# Patient Record
Sex: Male | Born: 1950 | Race: White | Hispanic: No | Marital: Single | Smoking: Former smoker
Health system: Southern US, Community
[De-identification: ages and names within clinical notes are randomized; demographics above are authoritative.]

## PROBLEM LIST (undated history)

## (undated) DIAGNOSIS — F1910 Other psychoactive substance abuse, uncomplicated: Secondary | ICD-10-CM

## (undated) DIAGNOSIS — B1920 Unspecified viral hepatitis C without hepatic coma: Secondary | ICD-10-CM

## (undated) DIAGNOSIS — F419 Anxiety disorder, unspecified: Secondary | ICD-10-CM

## (undated) DIAGNOSIS — Z5189 Encounter for other specified aftercare: Secondary | ICD-10-CM

## (undated) DIAGNOSIS — J449 Chronic obstructive pulmonary disease, unspecified: Secondary | ICD-10-CM

## (undated) DIAGNOSIS — C801 Malignant (primary) neoplasm, unspecified: Secondary | ICD-10-CM

## (undated) HISTORY — PX: OTHER SURGICAL HISTORY: SHX169

## (undated) HISTORY — DX: Anxiety disorder, unspecified: F41.9

## (undated) HISTORY — DX: Chronic obstructive pulmonary disease, unspecified: J44.9

## (undated) HISTORY — DX: Unspecified viral hepatitis C without hepatic coma: B19.20

## (undated) HISTORY — DX: Other psychoactive substance abuse, uncomplicated: F19.10

## (undated) HISTORY — DX: Encounter for other specified aftercare: Z51.89

---

## 2005-06-09 ENCOUNTER — Emergency Department: Payer: Self-pay | Admitting: Emergency Medicine

## 2005-10-18 ENCOUNTER — Emergency Department (HOSPITAL_COMMUNITY): Admission: EM | Admit: 2005-10-18 | Discharge: 2005-10-18 | Payer: Self-pay | Admitting: Emergency Medicine

## 2006-01-26 ENCOUNTER — Emergency Department: Payer: Self-pay | Admitting: Emergency Medicine

## 2006-01-26 ENCOUNTER — Other Ambulatory Visit: Payer: Self-pay

## 2007-10-19 ENCOUNTER — Ambulatory Visit: Payer: Self-pay | Admitting: Internal Medicine

## 2007-10-19 DIAGNOSIS — M79609 Pain in unspecified limb: Secondary | ICD-10-CM | POA: Insufficient documentation

## 2008-09-18 ENCOUNTER — Emergency Department (HOSPITAL_COMMUNITY): Admission: EM | Admit: 2008-09-18 | Discharge: 2008-09-18 | Payer: Self-pay | Admitting: Emergency Medicine
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
## 2010-10-24 ENCOUNTER — Emergency Department (HOSPITAL_COMMUNITY): Payer: Worker's Compensation

## 2010-10-24 ENCOUNTER — Emergency Department (HOSPITAL_COMMUNITY): Payer: Self-pay

## 2010-10-24 ENCOUNTER — Emergency Department (HOSPITAL_COMMUNITY)
Admission: EM | Admit: 2010-10-24 | Discharge: 2010-10-24 | Disposition: A | Discharge status: Home / Self Care | Payer: Worker's Compensation | Attending: Emergency Medicine | Admitting: Emergency Medicine

## 2010-10-24 DIAGNOSIS — IMO0002 Reserved for concepts with insufficient information to code with codable children: Secondary | ICD-10-CM | POA: Insufficient documentation

## 2010-10-24 DIAGNOSIS — Y990 Civilian activity done for income or pay: Secondary | ICD-10-CM | POA: Insufficient documentation

## 2010-10-24 DIAGNOSIS — W19XXXA Unspecified fall, initial encounter: Secondary | ICD-10-CM | POA: Insufficient documentation

## 2010-10-24 DIAGNOSIS — S20219A Contusion of unspecified front wall of thorax, initial encounter: Secondary | ICD-10-CM | POA: Insufficient documentation

## 2010-10-24 DIAGNOSIS — S88119A Complete traumatic amputation at level between knee and ankle, unspecified lower leg, initial encounter: Secondary | ICD-10-CM | POA: Insufficient documentation

## 2011-01-14 ENCOUNTER — Other Ambulatory Visit (HOSPITAL_COMMUNITY): Payer: Self-pay | Admitting: Family Medicine

## 2011-01-14 ENCOUNTER — Ambulatory Visit (HOSPITAL_COMMUNITY)
Admission: RE | Admit: 2011-01-14 | Discharge: 2011-01-14 | Disposition: A | Discharge status: Home / Self Care | Payer: Medicare Other | Source: Ambulatory Visit | Attending: Family Medicine | Admitting: Family Medicine

## 2011-01-14 DIAGNOSIS — M25559 Pain in unspecified hip: Secondary | ICD-10-CM | POA: Insufficient documentation

## 2011-01-14 DIAGNOSIS — M51379 Other intervertebral disc degeneration, lumbosacral region without mention of lumbar back pain or lower extremity pain: Secondary | ICD-10-CM | POA: Insufficient documentation

## 2011-01-14 DIAGNOSIS — M1990 Unspecified osteoarthritis, unspecified site: Secondary | ICD-10-CM

## 2011-01-14 DIAGNOSIS — M545 Low back pain, unspecified: Secondary | ICD-10-CM | POA: Insufficient documentation

## 2011-01-14 DIAGNOSIS — M5137 Other intervertebral disc degeneration, lumbosacral region: Secondary | ICD-10-CM | POA: Insufficient documentation

## 2011-01-27 ENCOUNTER — Telehealth (INDEPENDENT_AMBULATORY_CARE_PROVIDER_SITE_OTHER): Payer: Self-pay | Admitting: *Deleted

## 2011-01-27 NOTE — Telephone Encounter (Signed)
Recvd referral on 01/15/2011 for hep c,called patient to advise of Sept appt with NUR he stated that he could not wait and wanted to go to [facility].so i shredded referral and patient hung up.On July the 23rd recvd second referral and called patient ,advised him appt will still be in September ,again the man cursed me and stated he has read everything it was to know about hep c from the Internet.i advised patient this was a very complex office visit with testing and teaching ,he then said he knew more than Dr Laural Golden and our office.at this point a appt will not be made for this gentleman due to his rudeness and cursing wanted to type  documentation can be made.

## 2011-01-28 NOTE — Telephone Encounter (Signed)
I agree with not making an appointment for this patient please let the primary care physician know thank you

## 2011-04-03 ENCOUNTER — Other Ambulatory Visit (HOSPITAL_COMMUNITY): Payer: Self-pay | Admitting: Family Medicine

## 2011-04-03 DIAGNOSIS — E0590 Thyrotoxicosis, unspecified without thyrotoxic crisis or storm: Secondary | ICD-10-CM

## 2011-04-08 ENCOUNTER — Encounter (HOSPITAL_COMMUNITY): Payer: Medicare Other

## 2011-04-09 ENCOUNTER — Encounter (HOSPITAL_COMMUNITY): Payer: Medicare Other

## 2011-05-01 ENCOUNTER — Ambulatory Visit (INDEPENDENT_AMBULATORY_CARE_PROVIDER_SITE_OTHER): Payer: Medicare Other | Admitting: Gastroenterology

## 2011-05-01 DIAGNOSIS — B182 Chronic viral hepatitis C: Secondary | ICD-10-CM

## 2011-05-01 DIAGNOSIS — D649 Anemia, unspecified: Secondary | ICD-10-CM

## 2011-05-01 LAB — CBC WITH DIFFERENTIAL/PLATELET
Basophils Absolute: 0 10*3/uL (ref 0.0–0.1)
Basophils Relative: 1 % (ref 0–1)
Eosinophils Absolute: 0.2 10*3/uL (ref 0.0–0.7)
Eosinophils Relative: 4 % (ref 0–5)
HCT: 49.4 % (ref 39.0–52.0)
Hemoglobin: 16.7 g/dL (ref 13.0–17.0)
Lymphocytes Relative: 37 % (ref 12–46)
Lymphs Abs: 2.1 10*3/uL (ref 0.7–4.0)
MCH: 31.6 pg (ref 26.0–34.0)
MCHC: 33.8 g/dL (ref 30.0–36.0)
MCV: 93.4 fL (ref 78.0–100.0)
Monocytes Absolute: 0.5 10*3/uL (ref 0.1–1.0)
Monocytes Relative: 8 % (ref 3–12)
Neutro Abs: 2.9 10*3/uL (ref 1.7–7.7)
Neutrophils Relative %: 50 % (ref 43–77)
Platelets: 224 10*3/uL (ref 150–400)
RBC: 5.29 MIL/uL (ref 4.22–5.81)
RDW: 13.4 % (ref 11.5–15.5)
WBC: 5.7 10*3/uL (ref 4.0–10.5)

## 2011-05-01 LAB — TSH: TSH: 1.264 u[IU]/mL (ref 0.350–4.500)

## 2011-05-02 LAB — COMPLETE METABOLIC PANEL WITH GFR
ALT: 140 U/L — ABNORMAL HIGH (ref 0–53)
AST: 134 U/L — ABNORMAL HIGH (ref 0–37)
Albumin: 4.5 g/dL (ref 3.5–5.2)
Alkaline Phosphatase: 98 U/L (ref 39–117)
BUN: 12 mg/dL (ref 6–23)
CO2: 19 mEq/L (ref 19–32)
Calcium: 9 mg/dL (ref 8.4–10.5)
Chloride: 102 mEq/L (ref 96–112)
Creat: 0.74 mg/dL (ref 0.50–1.35)
GFR, Est African American: >90 mL/min (ref >90)
GFR, Est Non African American: >90 mL/min (ref >90)
Glucose, Bld: 88 mg/dL (ref 70–99)
Potassium: 4.1 mEq/L (ref 3.5–5.3)
Sodium: 139 mEq/L (ref 135–145)
Total Bilirubin: 1 mg/dL (ref 0.3–1.2)
Total Protein: 7.3 g/dL (ref 6.0–8.3)

## 2011-05-02 LAB — AFP TUMOR MARKER: AFP-Tumor Marker: 18.4 ng/mL — ABNORMAL HIGH (ref 0.0–8.0)

## 2011-05-02 LAB — HEPATITIS B CORE ANTIBODY, TOTAL: Hep B Core Total Ab: POSITIVE — AB

## 2011-05-02 LAB — PROTIME-INR
INR: 0.98 (ref <1.50)
Prothrombin Time: 13.4 seconds (ref 11.6–15.2)

## 2011-05-02 LAB — ANA: Anti Nuclear Antibody(ANA): NEGATIVE

## 2011-05-02 LAB — HEPATITIS A ANTIBODY, TOTAL: Hep A Total Ab: NEGATIVE

## 2011-05-02 LAB — HEPATITIS B SURFACE ANTIBODY,QUALITATIVE: Hep B S Ab: POSITIVE — AB

## 2011-05-08 NOTE — Progress Notes (Addendum)
NAME:  Jermaine Gonzalez, Jermaine Gonzalez  MR#:  [id number]      DATE:  05/01/2011  DOB:  1951-06-19    cc: Consulting Physician:  None. Primary Care Physician:  Same. Referring Physician:  Pearson Grippe, MD, [facility], [address], [place], [place] [address], Fax [phone]    Reason for visit:  Positive qualitative hepatitis C RNA.    History:  The patient is a 60 year old gentleman who I have been asked to see in consultation by Dr. Lorriane Shire regarding a positive qualitative HCV RNA.   According to the patient, he was first diagnosed with hepatitis C approximately 6-7 years ago, but was not assessed for treatment until now. He is unaware of his genotype. He has not been biopsied. He  currently has no symptoms referable to his history of hepatitis C nor are there symptoms to suggest cryoglobulin mediated or decompensated liver disease.  With respect to risk factors for liver disease, he drank alcohol on a daily basis until a year ago when he stopped and started to attend AA. He now attends AA meetings twice weekly. There is no history of DWIs.  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 He has tattoos but no unsterile body piercing or blood transfusion prior to 1992. There is no family  history of liver disease. 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   PAST MEDICAL HISTORY:  Denies diabetes, dyslipidemia, coronary artery disease, hypertension, or dysthyroidism.    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 Surgery to left knee cap. 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    Past psychiatric history:  Denies.   CURRENT MEDICATIONS:  Xanax point p.r.n., ibuprofen p.r.n.   ALLERGIES:  Denies.     Habits:  Smoking, less than half pack of cigarettes per day.   FAMILY HISTORY:  As above.    Social history:  As above.   REVIEW OF SYSTEMS:  All 10 systems reviewed today with the patient and the review was signed and placed in the chart. His CES-D was 20.   PHYSICAL EXAMINATION:    Constitutional:  Appeared stated age without significant peripheral wasting. Vital signs: Height 69.5 inches, weight 236 pounds, blood pressure 155/100, pulse of 75, temperature 97.8 Fahrenheit. Ears, nose, mouth and  throat:  Unremarkable oropharynx.  No thyromegaly or neck masses.  Chest:  Resonant to percussion.  Clear to auscultation.  Cardiovascular:  Heart sounds normal S1, S2 without murmurs or rubs.   There is no peripheral edema.  Abdominal:  Normal bowel sounds.  No masses or tenderness.  I could not appreciate a liver edge or spleen tip.  I could not appreciate any hernias.  Lymphatics:  No cervical or  inguinal lymphadenopathy.  Central Nervous System:  No asterixis or focal neurologic findings.  Dermatologic:  Anicteric without palmar  erythema or spider angiomata.  Eyes:  Anicteric sclerae.  Pupils are equal and reactive to light.   laboratories:  On 12/16/2010, hepatitis B surface antigen was negative. His hepatitis C antibody was positive.  On 12/17/2010, his qualitative HCV RNA was positive. This is the only lab results I received.   Assessment:  The patient is a 60 year old man with history of genotype unknown hepatitis C,  naive to treatment. He appears to be clinically well compensated. I do not have any lab work to determine how biochemically  compensated he is. Though he has an alcohol history in the past, to his credit, he stopped drinking over a year ago and is attending counseling. I do not see any contraindication to treating him should  he be willing.   In my discussion today with the patient, we discussed the nature and natural history of hepatitis C. We discussed the  significance of genotyping him. We discussed the role of genotyping to determine the  need for biopsy. I explained how a biopsy is done and its utility for genotype 1. We then discussed treatment with pegylated interferon and ribavirin for all genotypes and the addition of telaprevir or  boceprevir if genotype 3. I reviewed the specific systems, constitutional, psychiatric side effects of therapy. We discussed response rates. I discussed treatment protocol for clinic. Having said  this the patient was interested in participating in treatment should he be eligible.  I also discussed the risk of contagion of hepatitis C.  We also discussed the possibility of participating in clinical trials. I have explained that we were not obliged to offer him trials as these are not standard of care. We discussed the fact the side effects and  efficacy are not completely known. However, the patient is willing to consider trial should he be eligible.   plan:  1. Standard labs. 2. Check genotype. 3. Test for hepatitis A and B immunity. 4. If genotype 1, proceed to liver biopsy and follow up thereafter. Genotype 2 or 3, proceed directly to follow up thereafter. 5. I will submit his records to [facility] to review for research purposes. 6. I have given him literature to read on hepatitis C.            Marty Heck, MD   ADDENDUM Hep A nave.  Hep B immune.  Genotype pending.    ADDENDUM  05/15/11  Genotype 1a - will order biopsy.  [place]  D:  Thu Oct 25 16:03:03 2012 ; T:  Thu Oct 25 17:40:17 2012  Job #:  [id number]

## 2011-05-12 LAB — HEPATITIS C GENOTYPE

## 2011-05-15 NOTE — Progress Notes (Signed)
Addended by: Marty Heck on: 05/15/2011 09:32 PM   Modules accepted: Orders

## 2011-08-28 ENCOUNTER — Encounter (HOSPITAL_COMMUNITY): Payer: Self-pay

## 2011-08-28 ENCOUNTER — Encounter (HOSPITAL_COMMUNITY)
Admission: RE | Admit: 2011-08-28 | Discharge: 2011-08-28 | Disposition: A | Discharge status: Home / Self Care | Payer: Medicare Other | Source: Ambulatory Visit | Attending: Family Medicine | Admitting: Family Medicine

## 2011-08-28 DIAGNOSIS — E0590 Thyrotoxicosis, unspecified without thyrotoxic crisis or storm: Secondary | ICD-10-CM

## 2011-08-28 HISTORY — DX: Malignant (primary) neoplasm, unspecified: C80.1

## 2011-08-28 MED ORDER — SODIUM IODIDE I 131 CAPSULE
10.0000 | Freq: Once | INTRAVENOUS | Status: AC | PRN
  Administered 2011-08-28: 13 via ORAL

## 2011-08-29 ENCOUNTER — Encounter (HOSPITAL_COMMUNITY)
Admission: RE | Admit: 2011-08-29 | Discharge: 2011-08-29 | Disposition: A | Discharge status: Home / Self Care | Payer: Medicare Other | Source: Ambulatory Visit | Attending: Family Medicine | Admitting: Family Medicine

## 2011-08-29 DIAGNOSIS — E0590 Thyrotoxicosis, unspecified without thyrotoxic crisis or storm: Secondary | ICD-10-CM | POA: Insufficient documentation

## 2011-08-29 MED ORDER — SODIUM PERTECHNETATE TC 99M INJECTION
10.0000 | Freq: Once | INTRAVENOUS | Status: AC | PRN
  Administered 2011-08-29: 10 via INTRAVENOUS

## 2012-11-04 ENCOUNTER — Ambulatory Visit (HOSPITAL_COMMUNITY)
Admission: RE | Admit: 2012-11-04 | Discharge: 2012-11-04 | Disposition: A | Discharge status: Home / Self Care | Payer: Medicare Other | Source: Ambulatory Visit | Attending: Family Medicine | Admitting: Family Medicine

## 2012-11-04 ENCOUNTER — Other Ambulatory Visit (HOSPITAL_COMMUNITY): Payer: Self-pay | Admitting: Family Medicine

## 2012-11-04 DIAGNOSIS — M1990 Unspecified osteoarthritis, unspecified site: Secondary | ICD-10-CM

## 2012-11-04 DIAGNOSIS — M5136 Other intervertebral disc degeneration, lumbar region: Secondary | ICD-10-CM

## 2012-11-04 DIAGNOSIS — M5134 Other intervertebral disc degeneration, thoracic region: Secondary | ICD-10-CM

## 2012-11-04 DIAGNOSIS — M545 Low back pain, unspecified: Secondary | ICD-10-CM | POA: Insufficient documentation

## 2012-11-04 DIAGNOSIS — IMO0002 Reserved for concepts with insufficient information to code with codable children: Secondary | ICD-10-CM | POA: Insufficient documentation

## 2012-11-04 DIAGNOSIS — M546 Pain in thoracic spine: Secondary | ICD-10-CM | POA: Insufficient documentation

## 2014-03-01 ENCOUNTER — Encounter (INDEPENDENT_AMBULATORY_CARE_PROVIDER_SITE_OTHER): Payer: Self-pay | Admitting: *Deleted

## 2014-04-04 ENCOUNTER — Encounter (INDEPENDENT_AMBULATORY_CARE_PROVIDER_SITE_OTHER): Payer: Self-pay | Admitting: Internal Medicine

## 2014-04-04 ENCOUNTER — Ambulatory Visit (INDEPENDENT_AMBULATORY_CARE_PROVIDER_SITE_OTHER): Payer: Medicare HMO | Admitting: Internal Medicine

## 2014-04-04 ENCOUNTER — Encounter (INDEPENDENT_AMBULATORY_CARE_PROVIDER_SITE_OTHER): Payer: Self-pay | Admitting: *Deleted

## 2014-04-04 VITALS — BP 140/62 | HR 76 | Temp 97.7°F | Ht 69.5 in | Wt 141.6 lb

## 2014-04-04 DIAGNOSIS — B182 Chronic viral hepatitis C: Secondary | ICD-10-CM

## 2014-04-04 DIAGNOSIS — Z8619 Personal history of other infectious and parasitic diseases: Secondary | ICD-10-CM | POA: Insufficient documentation
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
## 2014-04-04 NOTE — Patient Instructions (Signed)
Labs and elastrography.

## 2014-04-06 ENCOUNTER — Ambulatory Visit (HOSPITAL_COMMUNITY)
Admission: RE | Admit: 2014-04-06 | Discharge: 2014-04-06 | Disposition: A | Discharge status: Home / Self Care | Payer: Medicare HMO | Source: Ambulatory Visit | Attending: Internal Medicine | Admitting: Internal Medicine

## 2014-04-06 DIAGNOSIS — B182 Chronic viral hepatitis C: Secondary | ICD-10-CM | POA: Diagnosis present

## 2014-04-06 DIAGNOSIS — R7989 Other specified abnormal findings of blood chemistry: Secondary | ICD-10-CM | POA: Insufficient documentation

## 2014-04-06 LAB — CBC WITH DIFFERENTIAL/PLATELET
Basophils Absolute: 0.1 10*3/uL (ref 0.0–0.1)
Basophils Relative: 1 % (ref 0–1)
Eosinophils Absolute: 0.4 10*3/uL (ref 0.0–0.7)
Eosinophils Relative: 7 % — ABNORMAL HIGH (ref 0–5)
HCT: 48.6 % (ref 39.0–52.0)
Hemoglobin: 17 g/dL (ref 13.0–17.0)
Lymphocytes Relative: 40 % (ref 12–46)
Lymphs Abs: 2.3 10*3/uL (ref 0.7–4.0)
MCH: 31.3 pg (ref 26.0–34.0)
MCHC: 35 g/dL (ref 30.0–36.0)
MCV: 89.5 fL (ref 78.0–100.0)
Monocytes Absolute: 0.5 10*3/uL (ref 0.1–1.0)
Monocytes Relative: 8 % (ref 3–12)
Neutro Abs: 2.5 10*3/uL (ref 1.7–7.7)
Neutrophils Relative %: 44 % (ref 43–77)
Platelets: 190 10*3/uL (ref 150–400)
RBC: 5.43 MIL/uL (ref 4.22–5.81)
RDW: 14.1 % (ref 11.5–15.5)
WBC: 5.7 10*3/uL (ref 4.0–10.5)

## 2014-04-06 LAB — HEPATIC FUNCTION PANEL
ALT: 108 U/L — ABNORMAL HIGH (ref 0–53)
AST: 80 U/L — ABNORMAL HIGH (ref 0–37)
Albumin: 4.1 g/dL (ref 3.5–5.2)
Alkaline Phosphatase: 124 U/L — ABNORMAL HIGH (ref 39–117)
Bilirubin, Direct: 0.1 mg/dL (ref 0.0–0.3)
Indirect Bilirubin: 0.3 mg/dL (ref 0.2–1.2)
Total Bilirubin: 0.4 mg/dL (ref 0.2–1.2)
Total Protein: 6.7 g/dL (ref 6.0–8.3)

## 2014-04-06 LAB — PROTIME-INR
INR: 1 (ref <1.50)
Prothrombin Time: 13.2 seconds (ref 11.6–15.2)

## 2014-04-07 LAB — HEPATITIS C RNA QUANTITATIVE
HCV Quantitative Log: 6.35 {Log} — ABNORMAL HIGH (ref <1.18)
HCV Quantitative: 2224382 IU/mL — ABNORMAL HIGH (ref <15)

## 2014-04-07 LAB — AFP TUMOR MARKER: AFP-Tumor Marker: 19.1 ng/mL — ABNORMAL HIGH (ref <6.1)

## 2014-04-09 IMAGING — CR DG LUMBAR SPINE COMPLETE 4+V
5 series · 5 of 5 positions shown · non-contrast
Comparison: 01/14/2011

CLINICAL DATA: Evaluate for degenerative disc disease.  Lower back
pain with no recent injury.  Prosthetic leg

LUMBAR SPINE - COMPLETE 4+ VIEW

[view not recorded (1 of 5)]
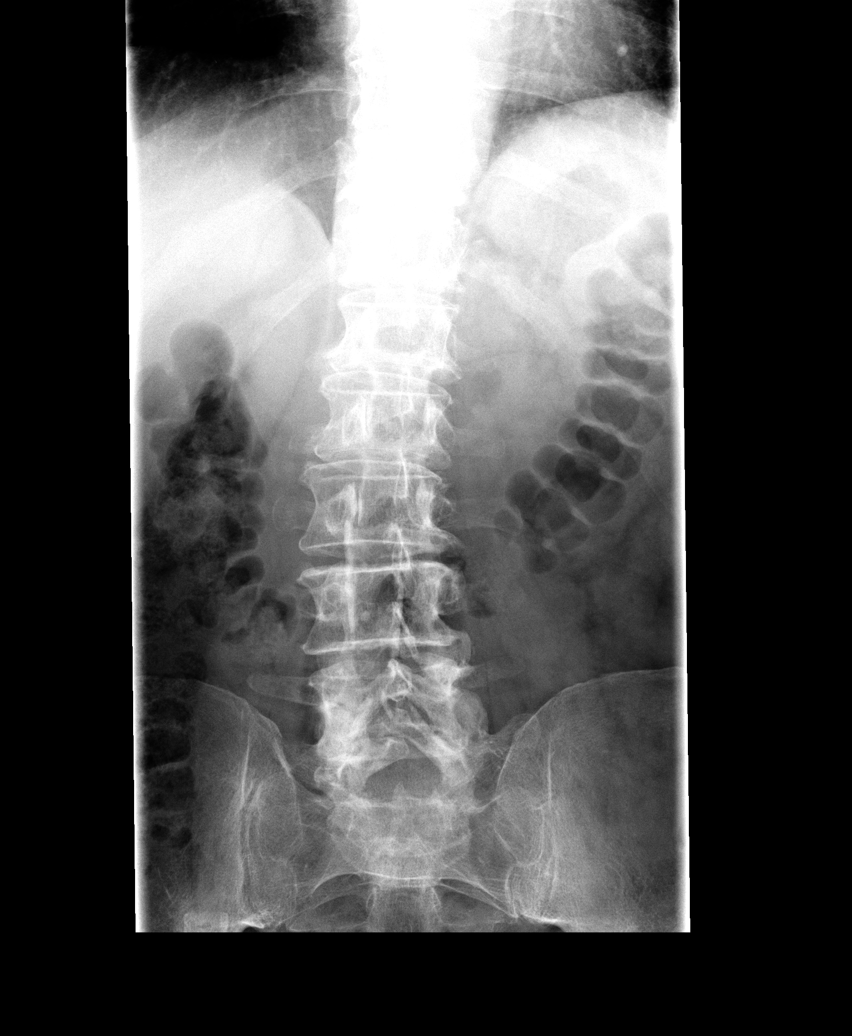

[view not recorded (2 of 5)]
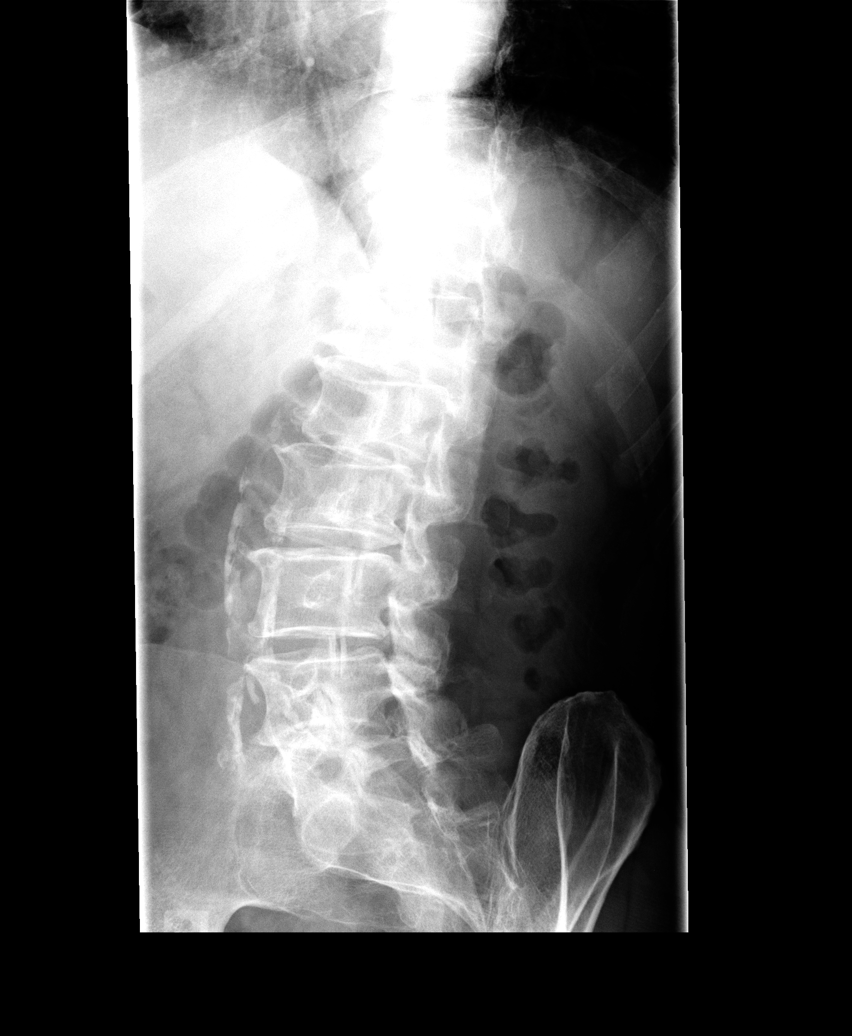

[view not recorded (3 of 5)]
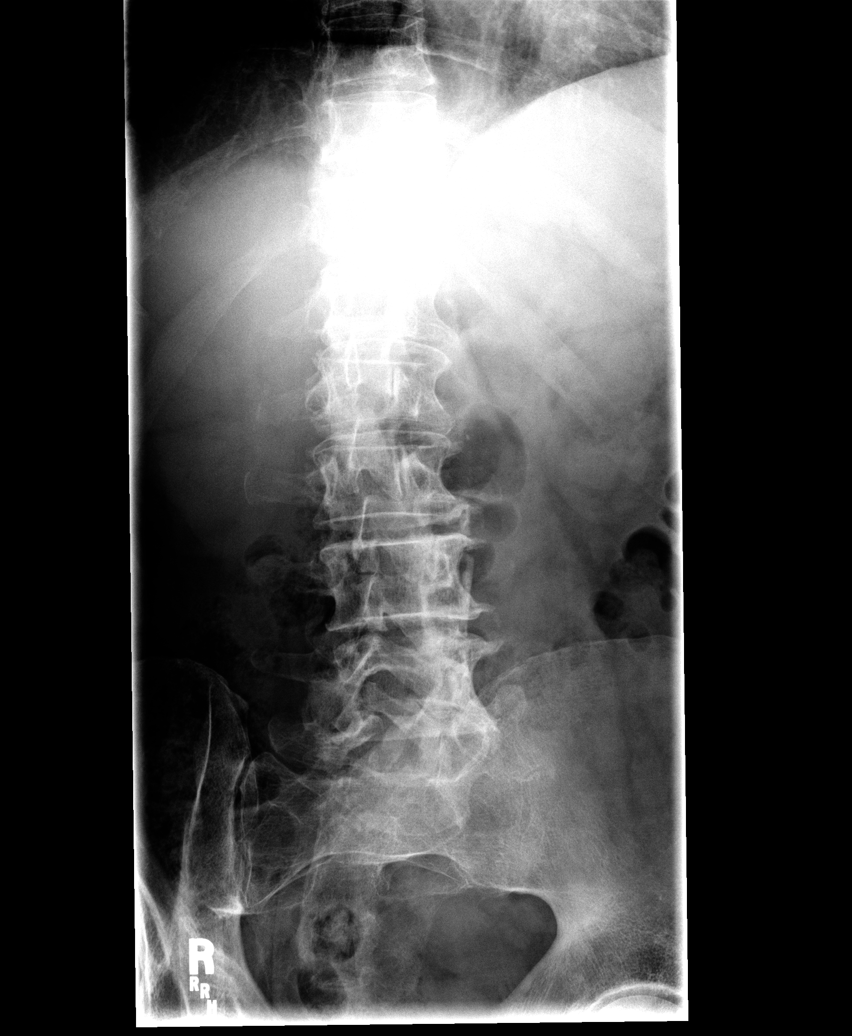

[view not recorded (4 of 5)]
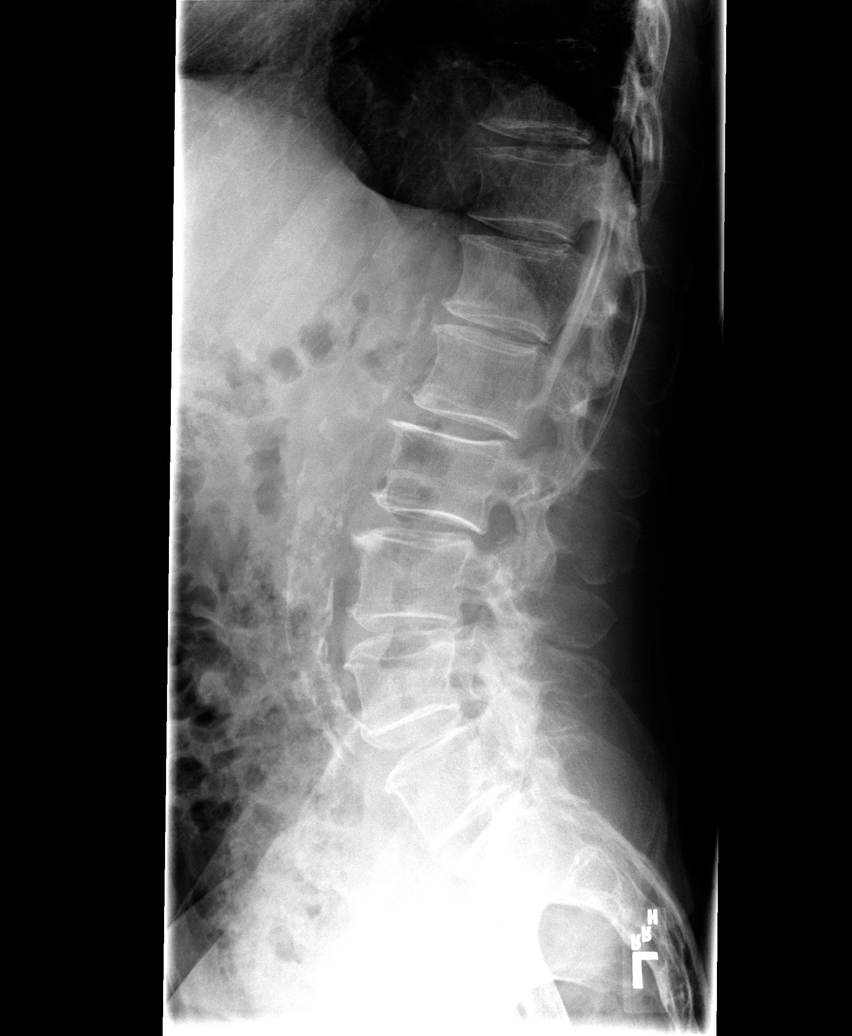

[view not recorded (5 of 5)]
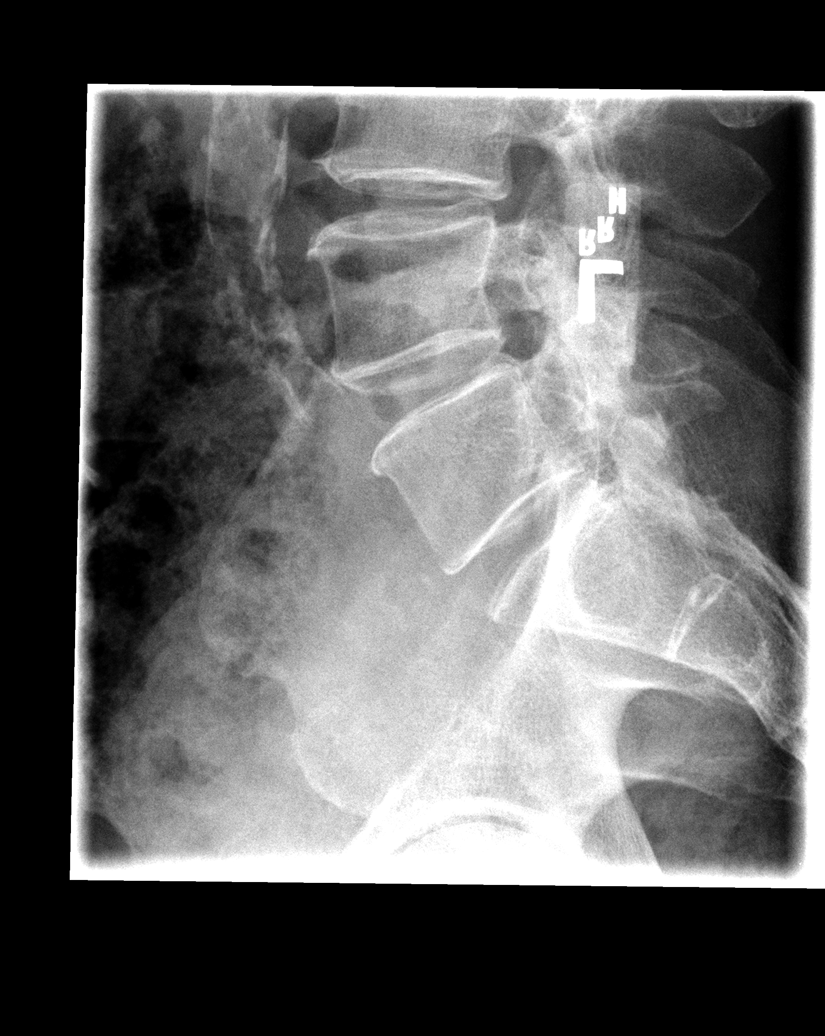

[5 of 5 positions shown; findings below may reference images not displayed]

FINDINGS: There are six lumbar type vertebral bodies with an L6
transitional vertebral body with spina bifida occulta and left
sided assimilation joint. Mild curvature of the lumbar spine,
convex right appears stable. Bony alignment otherwise appears
intact. Vertebral body heights appear maintained.

There is mild disc space narrowing identified throughout the lumbar
spine and the degree of narrowing at the individual disc spaces
appears unchanged in comparison with prior exam.  Mild
osteophytosis at multiple levels also appears stable.  Oblique
views demonstrate mild facet sclerosis at multiple levels without
associated pars defects.  No significant advanced in the degree of
degenerative change is apparent since 7827.

The sacroiliac joints appear maintained and the sacral white lines
appear intact.

No acute bony abnormality is identified.
IMPRESSION: Unchanged degenerative change since 7827 as noted above.  No acute
findings.

## 2014-04-14 ENCOUNTER — Telehealth (INDEPENDENT_AMBULATORY_CARE_PROVIDER_SITE_OTHER): Payer: Self-pay | Admitting: Internal Medicine

## 2014-04-14 NOTE — Telephone Encounter (Signed)
Have received approval for Harvoni x 8 weeks. I have given Jermaine Gonzalez the Rx. When he receives the medication, he will call our office and let us know his start date. He knows he will need an OV every 4 week and he says he will be compliant.

## 2014-04-18 ENCOUNTER — Telehealth (INDEPENDENT_AMBULATORY_CARE_PROVIDER_SITE_OTHER): Payer: Self-pay | Admitting: Internal Medicine

## 2014-04-18 NOTE — Telephone Encounter (Signed)
He will start 04/19/2014. He has committed to OV every 4 weeks with labs.

## 2014-04-18 NOTE — Telephone Encounter (Signed)
Start date 04/19/2014   Jermaine Gonzalez in 4 weeks.    Tammy, Hep C quaint, Hepatic function and CBC with diff in 4 weeks.

## 2014-04-19 ENCOUNTER — Telehealth (INDEPENDENT_AMBULATORY_CARE_PROVIDER_SITE_OTHER): Payer: Self-pay | Admitting: *Deleted

## 2014-04-19 DIAGNOSIS — B182 Chronic viral hepatitis C: Secondary | ICD-10-CM

## 2014-04-19 NOTE — Telephone Encounter (Signed)
.  Per Lelon Perla patient will have labs drawn in 4 weeks. Patient started his Hepatitis C medication on 04/19/14.

## 2014-04-19 NOTE — Telephone Encounter (Signed)
Patient's labs are noted for 05/17/14.

## 2014-04-20 NOTE — Telephone Encounter (Signed)
Apt has been scheduled for 05/18/14 at 2:00 pm with Deberah Castle, NP.

## 2014-04-20 NOTE — Telephone Encounter (Signed)
Patient advised of lab and apt date. Voices understood.

## 2014-04-26 ENCOUNTER — Other Ambulatory Visit (INDEPENDENT_AMBULATORY_CARE_PROVIDER_SITE_OTHER): Payer: Self-pay | Admitting: *Deleted

## 2014-04-26 ENCOUNTER — Encounter (INDEPENDENT_AMBULATORY_CARE_PROVIDER_SITE_OTHER): Payer: Self-pay | Admitting: *Deleted

## 2014-04-26 DIAGNOSIS — B182 Chronic viral hepatitis C: Secondary | ICD-10-CM

## 2014-05-16 LAB — CBC WITH DIFFERENTIAL/PLATELET
Basophils Absolute: 0.1 10*3/uL (ref 0.0–0.1)
Basophils Relative: 1 % (ref 0–1)
Eosinophils Absolute: 0.4 10*3/uL (ref 0.0–0.7)
Eosinophils Relative: 6 % — ABNORMAL HIGH (ref 0–5)
HCT: 46.3 % (ref 39.0–52.0)
Hemoglobin: 16 g/dL (ref 13.0–17.0)
Lymphocytes Relative: 42 % (ref 12–46)
Lymphs Abs: 2.8 10*3/uL (ref 0.7–4.0)
MCH: 30.7 pg (ref 26.0–34.0)
MCHC: 34.6 g/dL (ref 30.0–36.0)
MCV: 88.9 fL (ref 78.0–100.0)
Monocytes Absolute: 0.5 10*3/uL (ref 0.1–1.0)
Monocytes Relative: 8 % (ref 3–12)
Neutro Abs: 2.8 10*3/uL (ref 1.7–7.7)
Neutrophils Relative %: 43 % (ref 43–77)
Platelets: 199 10*3/uL (ref 150–400)
RBC: 5.21 MIL/uL (ref 4.22–5.81)
RDW: 12.9 % (ref 11.5–15.5)
WBC: 6.6 10*3/uL (ref 4.0–10.5)

## 2014-05-17 ENCOUNTER — Ambulatory Visit (INDEPENDENT_AMBULATORY_CARE_PROVIDER_SITE_OTHER): Payer: Medicare HMO | Admitting: Internal Medicine

## 2014-05-17 ENCOUNTER — Encounter (INDEPENDENT_AMBULATORY_CARE_PROVIDER_SITE_OTHER): Payer: Self-pay | Admitting: Internal Medicine

## 2014-05-17 VITALS — BP 132/70 | HR 72 | Temp 97.2°F | Ht 69.0 in | Wt 141.0 lb

## 2014-05-17 DIAGNOSIS — B1921 Unspecified viral hepatitis C with hepatic coma: Secondary | ICD-10-CM

## 2014-05-17 LAB — HEPATITIS C RNA QUANTITATIVE
HCV Quantitative Log: 2.09 {Log} — ABNORMAL HIGH (ref <1.18)
HCV Quantitative: 122 IU/mL — ABNORMAL HIGH (ref <15)

## 2014-05-17 LAB — HEPATIC FUNCTION PANEL
ALT: 59 U/L — ABNORMAL HIGH (ref 0–53)
AST: 31 U/L (ref 0–37)
Albumin: 4.2 g/dL (ref 3.5–5.2)
Alkaline Phosphatase: 101 U/L (ref 39–117)
Bilirubin, Direct: 0.1 mg/dL (ref 0.0–0.3)
Indirect Bilirubin: 0.4 mg/dL (ref 0.2–1.2)
Total Bilirubin: 0.5 mg/dL (ref 0.2–1.2)
Total Protein: 6.9 g/dL (ref 6.0–8.3)

## 2014-05-17 NOTE — Patient Instructions (Signed)
OV in 4 weeks. CBC with diff and Hepatic function and Hep C quaint

## 2014-05-17 NOTE — Progress Notes (Signed)
   Subjective:    Patient ID: Jermaine Gonzalez, male    DOB: 1951-06-30, 63 y.o.   MRN: [id number]  HPI Here today for f/u of his Hepatitis C. This is week for four him. Genotype 1A 04/2011 Hep A antibody negative. Hepa B Core total antibody positive, Hep B surface antigen negative.  01/19/2014 AST 73, ALT 106, ALP 121 HCV RNA Quaint [id number], HCV RNA Log 6.49 TSH 2.264. [place] elastrography F3-F4  Will treat with Harvoni x 12weeks Started Treatment 04/19/2014  He tells me he feels good. He does have a headache. He does have some joint pain. Appetite is good. He has changed his eating habits. He is eating more fiber. Eating lots of fruit. He has quit smoking x 4 weeks. BMs are moving normal.   .   Quaintitive is not back.  CBC    Component Value Date/Time   WBC 6.6 05/16/2014 1024   RBC 5.21 05/16/2014 1024   HGB 16.0 05/16/2014 1024   HCT 46.3 05/16/2014 1024   PLT 199 05/16/2014 1024   MCV 88.9 05/16/2014 1024   MCH 30.7 05/16/2014 1024   MCHC 34.6 05/16/2014 1024   RDW 12.9 05/16/2014 1024   LYMPHSABS 2.8 05/16/2014 1024   MONOABS 0.5 05/16/2014 1024   EOSABS 0.4 05/16/2014 1024   BASOSABS 0.1 05/16/2014 1024   Hepatic Function Panel     Component Value Date/Time   PROT 6.9 05/16/2014 1028   ALBUMIN 4.2 05/16/2014 1028   AST 31 05/16/2014 1028   ALT 59* 05/16/2014 1028   ALKPHOS 101 05/16/2014 1028   BILITOT 0.5 05/16/2014 1028   BILIDIR 0.1 05/16/2014 1028   IBILI 0.4 05/16/2014 1028         Review of Systems Past Medical History  Diagnosis Date  . Cancer     skin cancer  . Asthma   . Hepatitis C     Past Surgical History  Procedure Laterality Date  . Skin cancer removal face      No Known Allergies  Current Outpatient Prescriptions on File Prior to Visit  Medication Sig Dispense Refill  . alprazolam (XANAX) 2 MG tablet Take 2 mg by mouth at bedtime as needed for sleep.    Marland Kitchen oxyCODONE-acetaminophen (PERCOCET) 10-325 MG per tablet Take 1 tablet by  mouth every 4 (four) hours as needed for pain.     No current facility-administered medications on file prior to visit.        Objective:   Physical Exam There were no vitals filed for this visit.  Filed Vitals:   05/17/14 1117  Height: 5\' 9"  (1.753 m)  Weight: 141 lb (63.957 kg)    Alert and oriented. Skin warm and dry. Oral mucosa is moist.   . Sclera anicteric, conjunctivae is pink. Thyroid not enlarged. No cervical lymphadenopathy. Lungs clear. Heart regular rate and rhythm.  Abdomen is soft. Bowel sounds are positive. No hepatomegaly. No abdominal masses felt. No tenderness.  No edema to lower extremities.         Assessment & Plan:  Hepatitis C. He seems to be doing well. Do not have quaint back. Liver enzymes look good. CBC good. OV in 4 weeks with a CBC with diff, Hepatic profile and quaint.

## 2014-05-18 ENCOUNTER — Ambulatory Visit (INDEPENDENT_AMBULATORY_CARE_PROVIDER_SITE_OTHER): Payer: Medicare HMO | Admitting: Internal Medicine

## 2014-05-19 ENCOUNTER — Telehealth (INDEPENDENT_AMBULATORY_CARE_PROVIDER_SITE_OTHER): Payer: Self-pay | Admitting: *Deleted

## 2014-05-19 DIAGNOSIS — B182 Chronic viral hepatitis C: Secondary | ICD-10-CM

## 2014-05-19 NOTE — Telephone Encounter (Signed)
.  Per Terri Setzer,NP patient is to have lab work in 4 weeks  

## 2014-05-24 ENCOUNTER — Other Ambulatory Visit (INDEPENDENT_AMBULATORY_CARE_PROVIDER_SITE_OTHER): Payer: Self-pay | Admitting: *Deleted

## 2014-05-24 ENCOUNTER — Encounter (INDEPENDENT_AMBULATORY_CARE_PROVIDER_SITE_OTHER): Payer: Self-pay | Admitting: *Deleted

## 2014-05-24 DIAGNOSIS — B182 Chronic viral hepatitis C: Secondary | ICD-10-CM

## 2014-06-12 LAB — CBC WITH DIFFERENTIAL/PLATELET
Basophils Absolute: 0 10*3/uL (ref 0.0–0.1)
Basophils Relative: 0 % (ref 0–1)
Eosinophils Absolute: 0.4 10*3/uL (ref 0.0–0.7)
Eosinophils Relative: 5 % (ref 0–5)
HCT: 45.1 % (ref 39.0–52.0)
Hemoglobin: 15.5 g/dL (ref 13.0–17.0)
Lymphocytes Relative: 40 % (ref 12–46)
Lymphs Abs: 3 10*3/uL (ref 0.7–4.0)
MCH: 30.8 pg (ref 26.0–34.0)
MCHC: 34.4 g/dL (ref 30.0–36.0)
MCV: 89.5 fL (ref 78.0–100.0)
MPV: 9.8 fL (ref 9.4–12.4)
Monocytes Absolute: 0.5 10*3/uL (ref 0.1–1.0)
Monocytes Relative: 7 % (ref 3–12)
Neutro Abs: 3.6 10*3/uL (ref 1.7–7.7)
Neutrophils Relative %: 48 % (ref 43–77)
Platelets: 227 10*3/uL (ref 150–400)
RBC: 5.04 MIL/uL (ref 4.22–5.81)
RDW: 13.3 % (ref 11.5–15.5)
WBC: 7.6 10*3/uL (ref 4.0–10.5)

## 2014-06-13 LAB — HEPATITIS C RNA QUANTITATIVE: HCV Quantitative: NOT DETECTED IU/mL (ref <15)

## 2014-06-13 LAB — HEPATIC FUNCTION PANEL
ALT: 41 U/L (ref 0–53)
AST: 31 U/L (ref 0–37)
Albumin: 4.4 g/dL (ref 3.5–5.2)
Alkaline Phosphatase: 99 U/L (ref 39–117)
Bilirubin, Direct: 0.2 mg/dL (ref 0.0–0.3)
Indirect Bilirubin: 0.5 mg/dL (ref 0.2–1.2)
Total Bilirubin: 0.7 mg/dL (ref 0.2–1.2)
Total Protein: 7.5 g/dL (ref 6.0–8.3)

## 2014-06-15 ENCOUNTER — Encounter (INDEPENDENT_AMBULATORY_CARE_PROVIDER_SITE_OTHER): Payer: Self-pay | Admitting: Internal Medicine

## 2014-06-15 ENCOUNTER — Ambulatory Visit (INDEPENDENT_AMBULATORY_CARE_PROVIDER_SITE_OTHER): Payer: Medicare HMO | Admitting: Internal Medicine

## 2014-06-15 VITALS — BP 132/84 | HR 64 | Temp 97.5°F | Ht 68.5 in | Wt 142.4 lb

## 2014-06-15 DIAGNOSIS — B1920 Unspecified viral hepatitis C without hepatic coma: Secondary | ICD-10-CM

## 2014-06-15 NOTE — Patient Instructions (Signed)
OV in 4 weeks.  
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
## 2014-06-16 ENCOUNTER — Telehealth (INDEPENDENT_AMBULATORY_CARE_PROVIDER_SITE_OTHER): Payer: Self-pay | Admitting: *Deleted

## 2014-06-16 DIAGNOSIS — B182 Chronic viral hepatitis C: Secondary | ICD-10-CM

## 2014-06-16 NOTE — Telephone Encounter (Signed)
.  Per Lelon Perla patient to have labs in 4 weeks.

## 2014-06-20 ENCOUNTER — Encounter (INDEPENDENT_AMBULATORY_CARE_PROVIDER_SITE_OTHER): Payer: Self-pay

## 2014-06-22 ENCOUNTER — Telehealth (INDEPENDENT_AMBULATORY_CARE_PROVIDER_SITE_OTHER): Payer: Self-pay | Admitting: *Deleted

## 2014-06-22 NOTE — Telephone Encounter (Signed)
Jermaine Gonzalez forgot to tell you he has been having a headache and the pain is right above his eye. He has also developed a cold and PCP is giving him Azithromycin. Is it okay for him to take this medicine with Harvoni? His return phone number is [phone].

## 2014-06-22 NOTE — Telephone Encounter (Signed)
I looked up Harvoni and Azithromycin was not listed as interfering with Harvoni. I spoke with patient

## 2014-06-23 ENCOUNTER — Encounter (INDEPENDENT_AMBULATORY_CARE_PROVIDER_SITE_OTHER): Payer: Self-pay | Admitting: *Deleted

## 2014-07-12 ENCOUNTER — Other Ambulatory Visit (INDEPENDENT_AMBULATORY_CARE_PROVIDER_SITE_OTHER): Payer: Self-pay | Admitting: *Deleted

## 2014-07-12 DIAGNOSIS — B182 Chronic viral hepatitis C: Secondary | ICD-10-CM

## 2014-07-12 LAB — CBC WITH DIFFERENTIAL/PLATELET
Basophils Absolute: 0.1 10*3/uL (ref 0.0–0.1)
Basophils Relative: 1 % (ref 0–1)
Eosinophils Absolute: 0.2 10*3/uL (ref 0.0–0.7)
Eosinophils Relative: 4 % (ref 0–5)
HCT: 42.5 % (ref 39.0–52.0)
Hemoglobin: 14.2 g/dL (ref 13.0–17.0)
Lymphocytes Relative: 39 % (ref 12–46)
Lymphs Abs: 2.4 10*3/uL (ref 0.7–4.0)
MCH: 30.2 pg (ref 26.0–34.0)
MCHC: 33.4 g/dL (ref 30.0–36.0)
MCV: 90.4 fL (ref 78.0–100.0)
MPV: 9.6 fL (ref 8.6–12.4)
Monocytes Absolute: 0.4 10*3/uL (ref 0.1–1.0)
Monocytes Relative: 7 % (ref 3–12)
Neutro Abs: 3 10*3/uL (ref 1.7–7.7)
Neutrophils Relative %: 49 % (ref 43–77)
Platelets: 240 10*3/uL (ref 150–400)
RBC: 4.7 MIL/uL (ref 4.22–5.81)
RDW: 13.6 % (ref 11.5–15.5)
WBC: 6.1 10*3/uL (ref 4.0–10.5)

## 2014-07-13 ENCOUNTER — Encounter (INDEPENDENT_AMBULATORY_CARE_PROVIDER_SITE_OTHER): Payer: Self-pay | Admitting: Internal Medicine

## 2014-07-13 ENCOUNTER — Ambulatory Visit (INDEPENDENT_AMBULATORY_CARE_PROVIDER_SITE_OTHER): Payer: Medicare HMO | Admitting: Internal Medicine

## 2014-07-13 VITALS — BP 122/82 | HR 76 | Temp 97.4°F | Ht 68.0 in | Wt 145.0 lb

## 2014-07-13 DIAGNOSIS — B1920 Unspecified viral hepatitis C without hepatic coma: Secondary | ICD-10-CM

## 2014-07-13 LAB — HEPATIC FUNCTION PANEL
ALT: 39 U/L (ref 0–53)
AST: 26 U/L (ref 0–37)
Albumin: 4.2 g/dL (ref 3.5–5.2)
Alkaline Phosphatase: 105 U/L (ref 39–117)
Bilirubin, Direct: 0.1 mg/dL (ref 0.0–0.3)
Indirect Bilirubin: 0.3 mg/dL (ref 0.2–1.2)
Total Bilirubin: 0.4 mg/dL (ref 0.2–1.2)
Total Protein: 7 g/dL (ref 6.0–8.3)

## 2014-07-13 LAB — PSA: PSA: 0.63 ng/mL (ref <=4.00)

## 2014-07-13 NOTE — Patient Instructions (Signed)
OV in 6 months,  6 months, CBC with diff, Hepatic function, Hep C quaint, AFP. [place] RUQ
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
## 2014-07-14 ENCOUNTER — Telehealth (INDEPENDENT_AMBULATORY_CARE_PROVIDER_SITE_OTHER): Payer: Self-pay | Admitting: *Deleted

## 2014-07-14 DIAGNOSIS — B182 Chronic viral hepatitis C: Secondary | ICD-10-CM

## 2014-07-14 LAB — HEPATITIS C RNA QUANTITATIVE: HCV Quantitative: NOT DETECTED IU/mL (ref <15)

## 2014-07-14 NOTE — Telephone Encounter (Signed)
.  Per Terri Setzer,NP patient to have labs in 6 months. 

## 2014-10-02 ENCOUNTER — Encounter (INDEPENDENT_AMBULATORY_CARE_PROVIDER_SITE_OTHER): Payer: Self-pay | Admitting: *Deleted

## 2014-10-02 ENCOUNTER — Telehealth (INDEPENDENT_AMBULATORY_CARE_PROVIDER_SITE_OTHER): Payer: Self-pay | Admitting: Internal Medicine

## 2014-10-02 DIAGNOSIS — B1920 Unspecified viral hepatitis C without hepatic coma: Secondary | ICD-10-CM

## 2014-10-02 NOTE — Telephone Encounter (Signed)
Needs [place] rt upper quadrant.

## 2014-10-02 NOTE — Telephone Encounter (Signed)
Letter mailed to patient asking him to call to schedule [place]

## 2014-11-21 ENCOUNTER — Encounter (INDEPENDENT_AMBULATORY_CARE_PROVIDER_SITE_OTHER): Payer: Self-pay | Admitting: *Deleted

## 2014-12-27 ENCOUNTER — Other Ambulatory Visit (INDEPENDENT_AMBULATORY_CARE_PROVIDER_SITE_OTHER): Payer: Self-pay | Admitting: *Deleted

## 2014-12-27 ENCOUNTER — Encounter (INDEPENDENT_AMBULATORY_CARE_PROVIDER_SITE_OTHER): Payer: Self-pay | Admitting: *Deleted

## 2014-12-27 DIAGNOSIS — B182 Chronic viral hepatitis C: Secondary | ICD-10-CM

## 2015-01-15 ENCOUNTER — Ambulatory Visit (INDEPENDENT_AMBULATORY_CARE_PROVIDER_SITE_OTHER): Payer: Medicare HMO | Admitting: Internal Medicine

## 2015-01-19 ENCOUNTER — Telehealth (INDEPENDENT_AMBULATORY_CARE_PROVIDER_SITE_OTHER): Payer: Self-pay | Admitting: *Deleted

## 2015-01-19 NOTE — Telephone Encounter (Signed)
noted 
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
## 2015-03-07 ENCOUNTER — Encounter (INDEPENDENT_AMBULATORY_CARE_PROVIDER_SITE_OTHER): Payer: Self-pay | Admitting: *Deleted

## 2015-06-13 ENCOUNTER — Telehealth (INDEPENDENT_AMBULATORY_CARE_PROVIDER_SITE_OTHER): Payer: Self-pay | Admitting: Internal Medicine

## 2015-06-13 DIAGNOSIS — B1920 Unspecified viral hepatitis C without hepatic coma: Secondary | ICD-10-CM

## 2015-06-13 NOTE — Telephone Encounter (Signed)
Orders for CBC , Hep C quaint, CMET, [place] RUQ

## 2015-06-15 LAB — CBC WITH DIFFERENTIAL/PLATELET
Basophils Absolute: 0 10*3/uL (ref 0.0–0.1)
Basophils Relative: 0 % (ref 0–1)
Eosinophils Absolute: 0.3 10*3/uL (ref 0.0–0.7)
Eosinophils Relative: 4 % (ref 0–5)
HCT: 44.6 % (ref 39.0–52.0)
Hemoglobin: 14.8 g/dL (ref 13.0–17.0)
Lymphocytes Relative: 43 % (ref 12–46)
Lymphs Abs: 3.1 10*3/uL (ref 0.7–4.0)
MCH: 30.1 pg (ref 26.0–34.0)
MCHC: 33.2 g/dL (ref 30.0–36.0)
MCV: 90.8 fL (ref 78.0–100.0)
MPV: 10.4 fL (ref 8.6–12.4)
Monocytes Absolute: 0.4 10*3/uL (ref 0.1–1.0)
Monocytes Relative: 6 % (ref 3–12)
Neutro Abs: 3.3 10*3/uL (ref 1.7–7.7)
Neutrophils Relative %: 47 % (ref 43–77)
Platelets: 241 10*3/uL (ref 150–400)
RBC: 4.91 MIL/uL (ref 4.22–5.81)
RDW: 13.8 % (ref 11.5–15.5)
WBC: 7.1 10*3/uL (ref 4.0–10.5)

## 2015-06-15 LAB — COMPREHENSIVE METABOLIC PANEL
ALT: 27 U/L (ref 9–46)
AST: 26 U/L (ref 10–35)
Albumin: 4.3 g/dL (ref 3.6–5.1)
Alkaline Phosphatase: 96 U/L (ref 40–115)
BUN: 18 mg/dL (ref 7–25)
CO2: 30 mmol/L (ref 20–31)
Calcium: 9.7 mg/dL (ref 8.6–10.3)
Chloride: 101 mmol/L (ref 98–110)
Creat: 1.07 mg/dL (ref 0.70–1.25)
Glucose, Bld: 105 mg/dL — ABNORMAL HIGH (ref 65–99)
Potassium: 4.5 mmol/L (ref 3.5–5.3)
Sodium: 137 mmol/L (ref 135–146)
Total Bilirubin: 0.5 mg/dL (ref 0.2–1.2)
Total Protein: 7.1 g/dL (ref 6.1–8.1)

## 2015-06-18 LAB — HEPATITIS C RNA QUANTITATIVE: HCV Quantitative: NOT DETECTED IU/mL (ref <15)

## 2015-06-20 ENCOUNTER — Ambulatory Visit (HOSPITAL_COMMUNITY)
Admission: RE | Admit: 2015-06-20 | Discharge: 2015-06-20 | Disposition: A | Discharge status: Home / Self Care | Payer: Medicare HMO | Source: Ambulatory Visit | Attending: Internal Medicine | Admitting: Internal Medicine

## 2015-06-20 DIAGNOSIS — B182 Chronic viral hepatitis C: Secondary | ICD-10-CM | POA: Insufficient documentation

## 2015-06-20 DIAGNOSIS — B1920 Unspecified viral hepatitis C without hepatic coma: Secondary | ICD-10-CM

## 2015-06-25 ENCOUNTER — Telehealth (INDEPENDENT_AMBULATORY_CARE_PROVIDER_SITE_OTHER): Payer: Self-pay | Admitting: *Deleted

## 2015-06-25 NOTE — Telephone Encounter (Signed)
Results given to patient

## 2015-06-25 NOTE — Telephone Encounter (Signed)
Jermaine Gonzalez returned your call, please call him at [phone]

## 2015-06-28 ENCOUNTER — Encounter: Payer: Self-pay | Admitting: *Deleted

## 2015-06-28 ENCOUNTER — Ambulatory Visit: Payer: Medicare Other | Admitting: Orthopedic Surgery

## 2016-01-10 ENCOUNTER — Emergency Department (HOSPITAL_COMMUNITY)
Admission: EM | Admit: 2016-01-10 | Discharge: 2016-01-10 | Disposition: A | Discharge status: Home / Self Care | Payer: Medicare HMO | Attending: Emergency Medicine | Admitting: Emergency Medicine

## 2016-01-10 ENCOUNTER — Encounter (HOSPITAL_COMMUNITY): Payer: Self-pay | Admitting: Emergency Medicine

## 2016-01-10 DIAGNOSIS — Y999 Unspecified external cause status: Secondary | ICD-10-CM | POA: Insufficient documentation

## 2016-01-10 DIAGNOSIS — S61213A Laceration without foreign body of left middle finger without damage to nail, initial encounter: Secondary | ICD-10-CM | POA: Diagnosis not present

## 2016-01-10 DIAGNOSIS — F17200 Nicotine dependence, unspecified, uncomplicated: Secondary | ICD-10-CM | POA: Insufficient documentation

## 2016-01-10 DIAGNOSIS — Z85828 Personal history of other malignant neoplasm of skin: Secondary | ICD-10-CM | POA: Insufficient documentation

## 2016-01-10 DIAGNOSIS — W231XXA Caught, crushed, jammed, or pinched between stationary objects, initial encounter: Secondary | ICD-10-CM | POA: Diagnosis not present

## 2016-01-10 DIAGNOSIS — Z79899 Other long term (current) drug therapy: Secondary | ICD-10-CM | POA: Diagnosis not present

## 2016-01-10 DIAGNOSIS — S61219A Laceration without foreign body of unspecified finger without damage to nail, initial encounter: Secondary | ICD-10-CM

## 2016-01-10 DIAGNOSIS — Y939 Activity, unspecified: Secondary | ICD-10-CM | POA: Insufficient documentation

## 2016-01-10 DIAGNOSIS — Y92810 Car as the place of occurrence of the external cause: Secondary | ICD-10-CM | POA: Diagnosis not present

## 2016-01-10 DIAGNOSIS — J45909 Unspecified asthma, uncomplicated: Secondary | ICD-10-CM | POA: Insufficient documentation

## 2016-01-10 MED ORDER — CEPHALEXIN 500 MG PO CAPS: 500.0000 mg | ORAL_CAPSULE | Freq: Four times a day (QID) | ORAL | Status: DC

## 2016-01-10 MED ORDER — LIDOCAINE HCL (PF) 2 % IJ SOLN
2.0000 mL | Freq: Once | INTRAMUSCULAR | Status: AC
  Administered 2016-01-10: 2 mL
  Filled 2016-01-10: qty 10

## 2016-01-10 MED ORDER — CEPHALEXIN 500 MG PO CAPS
500.0000 mg | ORAL_CAPSULE | Freq: Once | ORAL | Status: AC
  Administered 2016-01-10: 500 mg via ORAL
  Filled 2016-01-10: qty 1

## 2016-01-10 MED ORDER — TETANUS-DIPHTH-ACELL PERTUSSIS 5-2.5-18.5 LF-MCG/0.5 IM SUSP
0.5000 mL | Freq: Once | INTRAMUSCULAR | Status: AC
  Administered 2016-01-10: 0.5 mL via INTRAMUSCULAR
  Filled 2016-01-10: qty 0.5

## 2016-01-10 NOTE — ED Notes (Signed)
Laceration to left middle finger soaking in NS and betadine solution per PA verbal order.

## 2016-01-10 NOTE — Discharge Instructions (Signed)
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
## 2016-07-12 IMAGING — US US ABDOMEN LIMITED
1 series · 14 of 25 positions shown · non-contrast
Comparison: Abdominal ultrasound April 06, 2014

CLINICAL DATA: Chronic hepatitis-C

EXAM:
US ABDOMEN LIMITED - RIGHT UPPER QUADRANT

[Series 1: us abdomen limited · 0.17mm/px · 14 of 77 slices shown]
[im 1/77]
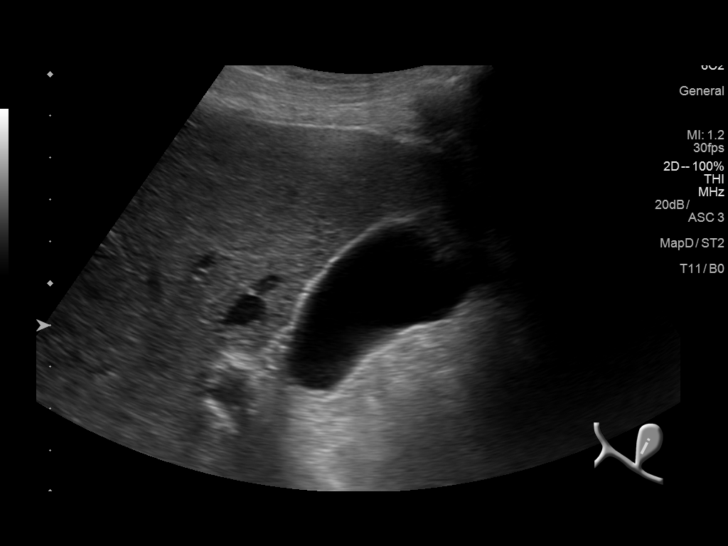
[im 7/77]
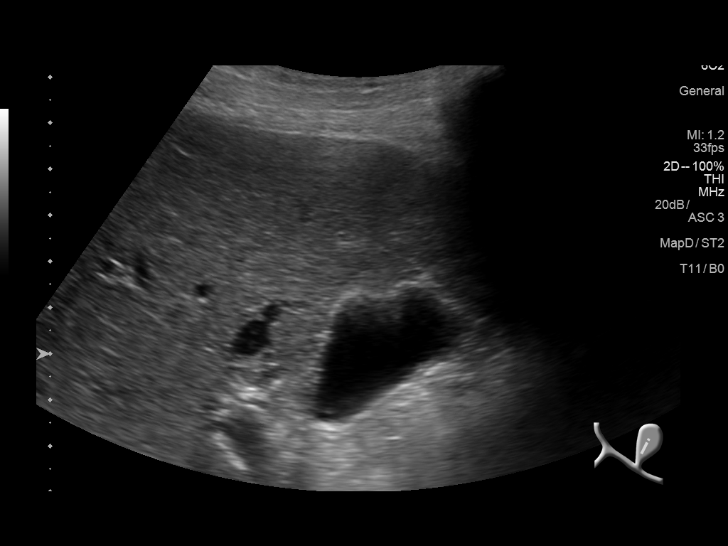
[im 13/77]
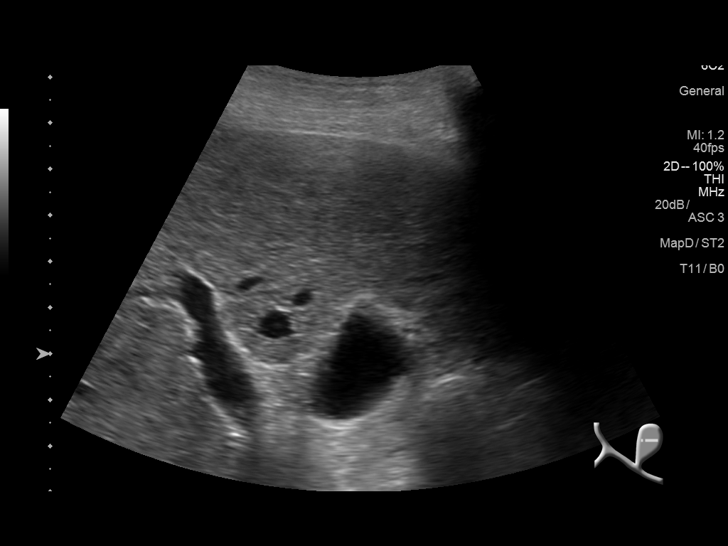
[im 20/77]
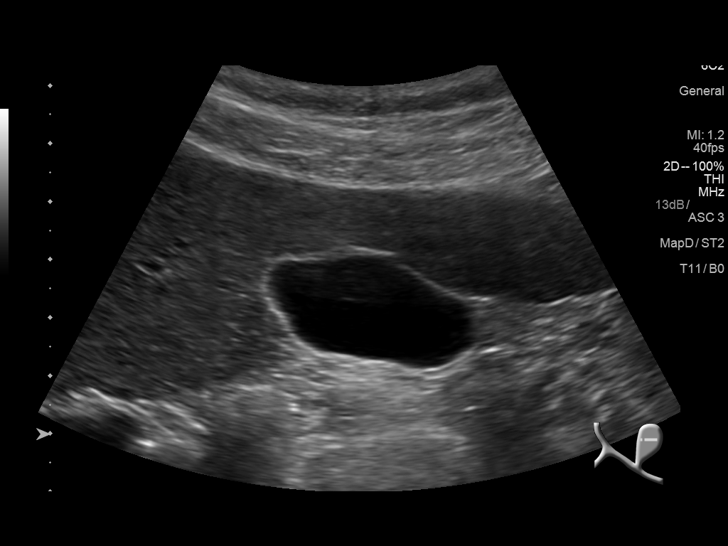
[im 26/77]
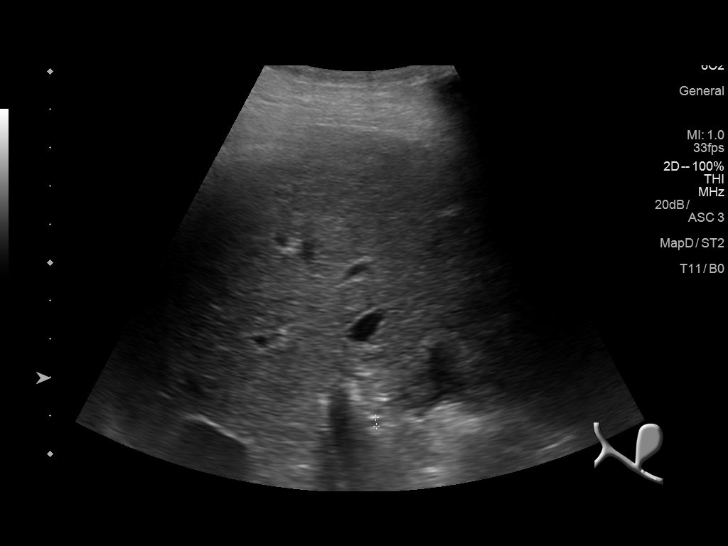
[im 29/77]
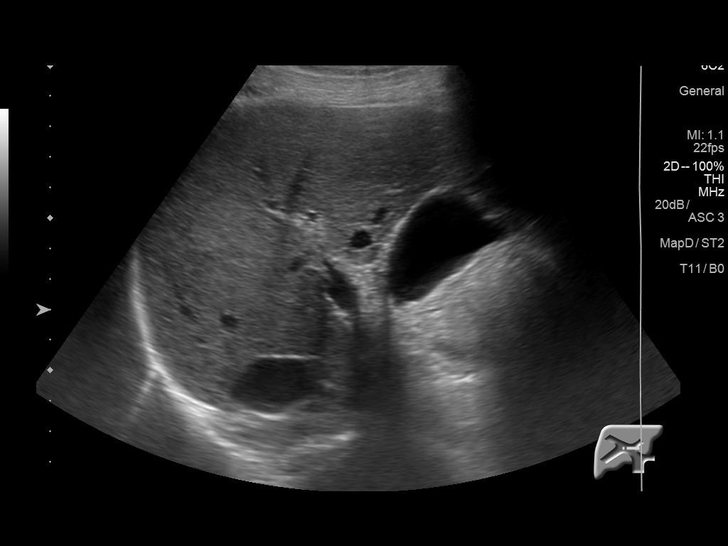
[im 35/77]
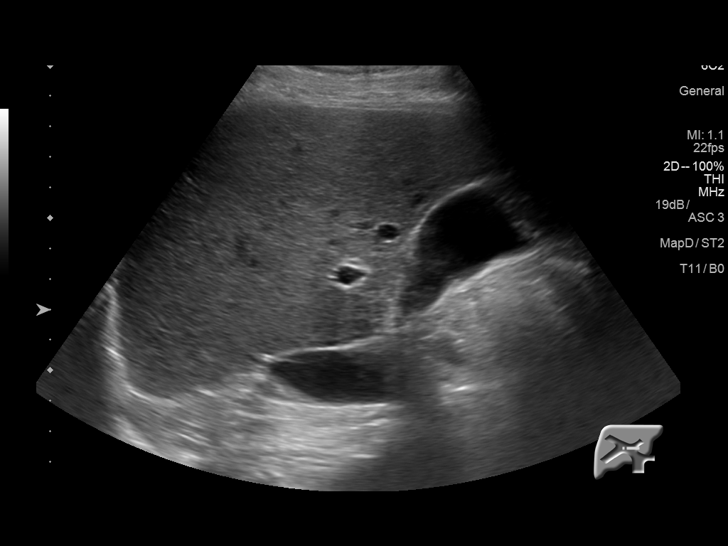
[im 42/77]
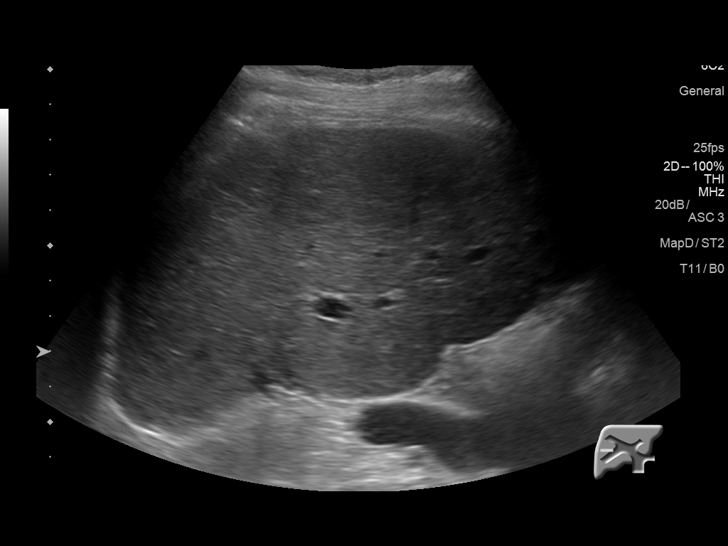
[im 48/77]
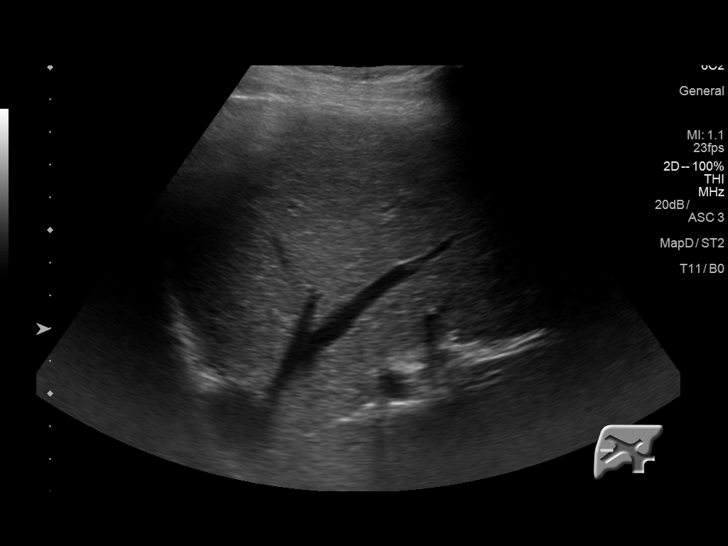
[im 51/77]
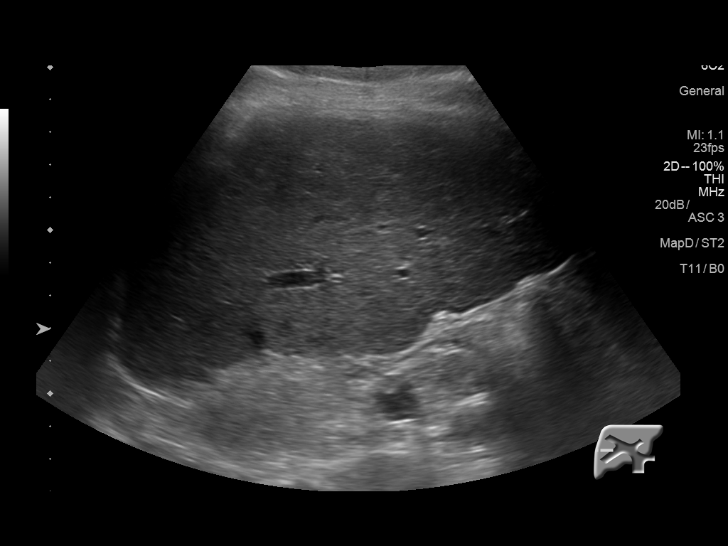
[im 58/77]
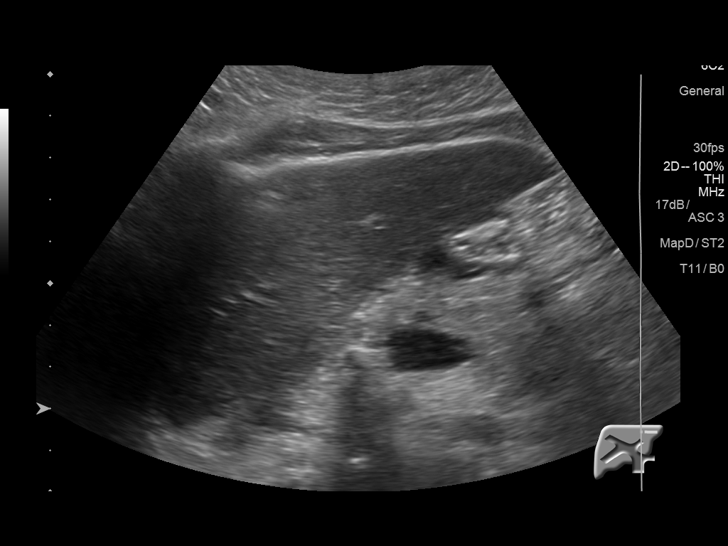
[im 64/77]
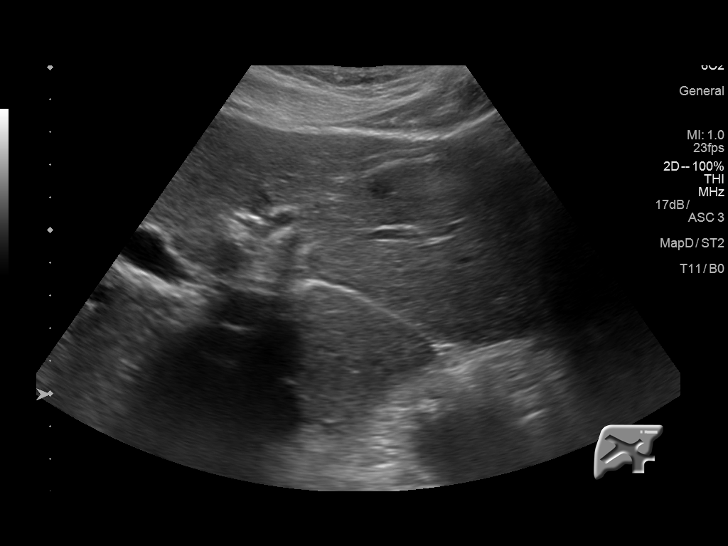
[im 70/77]
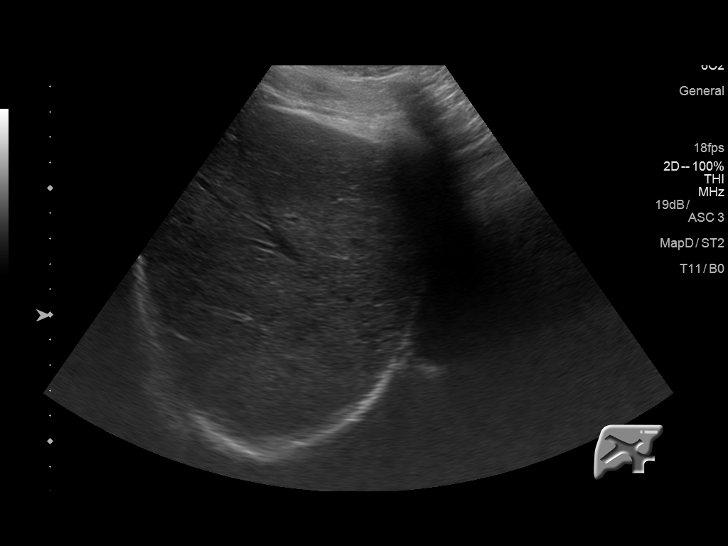
[im 77/77]
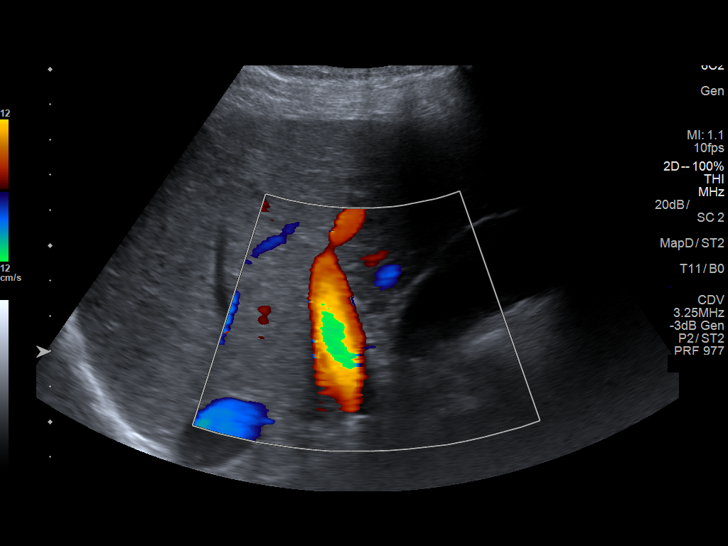

[14 of 25 positions shown; findings below may reference images not displayed]

FINDINGS: Gallbladder:

No gallstones or wall thickening visualized. No sonographic Murphy
sign noted.

Common bile duct:

Diameter: 2.9 mm

Liver:

The liver exhibits normal echotexture with no focal mass nor ductal
dilation. The surface contour of the liver is normal.
IMPRESSION: Normal limited right upper quadrant ultrasound examination.

## 2016-11-06 ENCOUNTER — Telehealth (INDEPENDENT_AMBULATORY_CARE_PROVIDER_SITE_OTHER): Payer: Self-pay | Admitting: Internal Medicine

## 2016-11-06 NOTE — Telephone Encounter (Signed)
Patient called, stated that he thinks his HEP C is back.  He is having the exact same symptoms as he did previously.  Stated that he feels like he has a weight on his forehead, stomach is really bothering him and he gets worn out doing anything.  He stated that when you call him back, if he doesn't answer immediately it's because he's either putting his leg on, in the bathroom or just too worn out to get it.  He doesn't feel good at all.  [phone]

## 2016-11-07 ENCOUNTER — Telehealth (INDEPENDENT_AMBULATORY_CARE_PROVIDER_SITE_OTHER): Payer: Self-pay | Admitting: Internal Medicine

## 2016-11-07 DIAGNOSIS — B182 Chronic viral hepatitis C: Secondary | ICD-10-CM

## 2016-11-07 NOTE — Telephone Encounter (Signed)
Message left on answering machine 

## 2016-11-07 NOTE — Telephone Encounter (Signed)
Hep C quaint ordered

## 2016-11-07 NOTE — Telephone Encounter (Signed)
Hep C quaint ordered.

## 2016-11-27 LAB — HEPATITIS C RNA QUANTITATIVE
HCV Quantitative Log: <1.18 Log IU/mL
HCV Quantitative: <15 IU/mL

## 2017-03-05 ENCOUNTER — Encounter: Payer: Self-pay | Admitting: Family Medicine

## 2017-03-19 ENCOUNTER — Emergency Department (HOSPITAL_COMMUNITY)
Admission: EM | Admit: 2017-03-19 | Discharge: 2017-03-19 | Disposition: A | Discharge status: Home / Self Care | Payer: Medicare HMO

## 2017-03-19 NOTE — ED Notes (Signed)
Pt left without being seen.

## 2017-04-02 ENCOUNTER — Encounter: Payer: Self-pay | Admitting: Family Medicine

## 2017-04-02 ENCOUNTER — Ambulatory Visit (INDEPENDENT_AMBULATORY_CARE_PROVIDER_SITE_OTHER): Payer: Medicare HMO | Admitting: Family Medicine

## 2017-04-02 VITALS — BP 148/84 | HR 69 | Ht 69.0 in | Wt 122.0 lb

## 2017-04-02 DIAGNOSIS — R030 Elevated blood-pressure reading, without diagnosis of hypertension: Secondary | ICD-10-CM | POA: Diagnosis not present

## 2017-04-02 DIAGNOSIS — F1021 Alcohol dependence, in remission: Secondary | ICD-10-CM | POA: Diagnosis not present

## 2017-04-02 DIAGNOSIS — F1320 Sedative, hypnotic or anxiolytic dependence, uncomplicated: Secondary | ICD-10-CM

## 2017-04-02 DIAGNOSIS — Z89511 Acquired absence of right leg below knee: Secondary | ICD-10-CM | POA: Diagnosis not present

## 2017-04-02 DIAGNOSIS — C4440 Unspecified malignant neoplasm of skin of scalp and neck: Secondary | ICD-10-CM

## 2017-04-02 DIAGNOSIS — B182 Chronic viral hepatitis C: Secondary | ICD-10-CM | POA: Diagnosis not present
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
## 2017-04-02 NOTE — Patient Instructions (Addendum)
Need old records Dr Cindie Laroche Need records from skin cancer  Stay as active as you can manage Reduce the xanax to 1/2 mg a day Do this for 1-2 months then go to 1/4 mg a day After this you can discontinue  See me in 2 months for a physical

## 2017-04-27 ENCOUNTER — Encounter: Payer: Self-pay | Admitting: Family Medicine

## 2017-04-27 ENCOUNTER — Ambulatory Visit (INDEPENDENT_AMBULATORY_CARE_PROVIDER_SITE_OTHER): Payer: Medicare HMO | Admitting: Family Medicine

## 2017-04-27 VITALS — BP 164/80 | HR 64 | Temp 98.2°F | Resp 18 | Ht 69.0 in | Wt 123.1 lb

## 2017-04-27 DIAGNOSIS — Z720 Tobacco use: Secondary | ICD-10-CM | POA: Diagnosis not present

## 2017-04-27 DIAGNOSIS — C4440 Unspecified malignant neoplasm of skin of scalp and neck: Secondary | ICD-10-CM | POA: Diagnosis not present

## 2017-04-27 DIAGNOSIS — F1021 Alcohol dependence, in remission: Secondary | ICD-10-CM

## 2017-04-27 DIAGNOSIS — I700 Atherosclerosis of aorta: Secondary | ICD-10-CM | POA: Insufficient documentation

## 2017-04-27 DIAGNOSIS — F1320 Sedative, hypnotic or anxiolytic dependence, uncomplicated: Secondary | ICD-10-CM

## 2017-04-27 MED ORDER — KETOCONAZOLE 2 % EX CREA: 1.0000 "application " | TOPICAL_CREAM | Freq: Every day | CUTANEOUS | 1 refills | Status: DC

## 2017-04-27 MED ORDER — ALPRAZOLAM 0.5 MG PO TABS: ORAL_TABLET | ORAL | 0 refills | Status: DC

## 2017-04-27 NOTE — Patient Instructions (Addendum)
Still need skin cancer records. Please  I have ordered labs I will send you a letter with your test results.  If there is anything of concern, we will call right away.  The xanax is down to 1 mg a day I have ordered 1/2 mg a day for 30 days Then 1/4 mg a day for 30 days Then stop  See me in 2-3 months

## 2017-04-27 NOTE — Progress Notes (Signed)
Chief Complaint  Patient presents with  . Follow-up    wants lab work   Patient is here sooner than his scheduled appointment.  He is requesting lab work.  He states that he has several vague physical exam findings and he thinks he may have an electrolyte imbalance.  He has been having leg cramps.  He has off and on a pruritic rash.  He also has noted foaming in the toilet when he urinates. The patient endorses belief in many alternate medicine and homeopathic practices and feels he knows the cures to diabetes and cancer.  He refuses standard medical care such as immunization practices, screening for lung cancer.  He believes his homeopathic practices will allow him health and well-being. Specifically denies need for flu shot, Pneumovax, shingles shot or tetanus Specifically denies need for prostate cancer screening or lung cancer screening He declines ultrasound for abdominal aortic aneurysm.  This I can support since he has had abdominal ultrasounds that did not show aneurysm.  He does have known atherosclerosis. He questions his taper of Xanax.  I explained to him at his first visit that I would not prescribe ongoing Xanax.  He was on 2 mg a day, reduced to 1 mg a day.  From here he is to reduce to 1/2 mg a day then 1/4 mg a day then discontinue.  I told him reducing it by half once every 30 days was a generous taper that I thought he could tolerate.  He believes he needs the Xanax for phantom limb pain.  I explained to him I feel that he has a good benzodiazepine dependence disorder especially given his history of alcoholism, tobacco dependence, and IV drug use in the past.  Patient Active Problem List   Diagnosis Date Noted  . Tobacco abuse 04/27/2017  . Atherosclerosis of aorta ([place]) 04/27/2017  . Skin cancer of scalp 04/02/2017  . S/P BKA (below knee amputation), right ([place]) 04/02/2017  . Benzodiazepine dependence ([place]) 04/02/2017  . Alcoholism in remission ([place]) 04/02/2017  .  Hepatitis C virus infection cured after antiviral drug therapy 04/04/2014  . LEG PAIN, RIGHT 10/19/2007    Outpatient Encounter Prescriptions as of 04/27/2017  Medication Sig  . [DISCONTINUED] alprazolam (XANAX) 2 MG tablet Take 1 mg by mouth at bedtime as needed for sleep.   Marland Kitchen ALPRAZolam (XANAX) 0.5 MG tablet Take one at night for a month, then half a pill at night then discontinue  . GINSENG PO Take by mouth.  Marland Kitchen ketoconazole (NIZORAL) 2 % cream Apply 1 application topically daily.  . NON FORMULARY Pro-Health multivitamin   No facility-administered encounter medications on file as of 04/27/2017.     No Known Allergies  Review of Systems  Constitutional: Negative for activity change, appetite change, fatigue and unexpected weight change.  HENT: Negative for congestion and dental problem.   Eyes: Negative for photophobia and visual disturbance.  Respiratory: Negative for cough and shortness of breath.   Cardiovascular: Negative for chest pain, palpitations and leg swelling.  Gastrointestinal: Negative for blood in stool, constipation and diarrhea.  Genitourinary: Negative for difficulty urinating and frequency.       "Foam"  Musculoskeletal: Negative for arthralgias and back pain.       Right BKA.  Occasional hip pain  Skin: Positive for rash.       Small bumps on arms (sun exposed areas)  Neurological: Negative for dizziness and headaches.  Psychiatric/Behavioral: Negative for dysphoric mood. The patient is not nervous/anxious.  BP (!) 164/80 (BP Location: Right Arm, Patient Position: Sitting, Cuff Size: Normal)   Pulse 64   Temp 98.2 F (36.8 C) (Temporal)   Resp 18   Ht 5\' 9"  (1.753 m)   Wt 123 lb 1.9 oz (55.8 kg)   SpO2 99%   BMI 18.18 kg/m   Physical Exam  Constitutional: He is oriented to person, place, and time. He appears well-developed and well-nourished.  HENT:  Head: Normocephalic and atraumatic.  Mouth/Throat: Oropharynx is clear and moist.  Scarring  face/nose/forehead  Eyes: Pupils are equal, round, and reactive to light. Conjunctivae are normal.  Neck: Normal range of motion. Neck supple. No thyromegaly present.  Cardiovascular: Normal rate, regular rhythm and normal heart sounds.   Pulmonary/Chest: Effort normal and breath sounds normal. No respiratory distress.  Decreased BS  Abdominal: Soft. Bowel sounds are normal.  Musculoskeletal: Normal range of motion.  Right BKA, slightly antalgic gait  Lymphadenopathy:    He has no cervical adenopathy.  Neurological: He is alert and oriented to person, place, and time.  Skin: Skin is warm and dry.  Psychiatric: He has a normal mood and affect. His speech is rapid and/or pressured. He is agitated. Thought content is delusional.  Mildly hyperkinetic.  Argumentative.  Talkative.  Pseudo-intellectual (I know as much as any doctor).  Becomes agitated when he realizes I am not refilling his current dose of Xanax  Nursing note and vitals reviewed.   ASSESSMENT/PLAN:  1. Tobacco abuse Unwilling to discuss cessation  2. Alcoholism in remission ([place])  3. Atherosclerosis of aorta ([facility]) - COMPLETE METABOLIC PANEL WITH GFR - CBC - Lipid panel - Urinalysis, Routine w reflex microscopic - Magnesium  4. Skin cancer of scalp By review of chart basal cell carcinoma 5. Benzodiazepine dependence ([facility]) Agitation with withdrawal discussion.  Patient is sent to behavioral letter.  Patient Instructions  Still need skin cancer records. Please  I have ordered labs I will send you a letter with your test results.  If there is anything of concern, we will call right away.  The xanax is down to 1 mg a day I have ordered 1/2 mg a day for 30 days Then 1/4 mg a day for 30 days Then stop  See me in 2-3 months      Raylene Everts, MD

## 2017-05-05 ENCOUNTER — Encounter: Payer: Self-pay | Admitting: Family Medicine

## 2017-05-07 ENCOUNTER — Ambulatory Visit: Payer: Medicare HMO | Admitting: Family Medicine

## 2017-06-02 ENCOUNTER — Encounter: Payer: Medicare HMO | Admitting: Family Medicine

## 2017-07-02 ENCOUNTER — Ambulatory Visit: Payer: Medicare HMO | Admitting: Family Medicine

## 2018-05-24 DIAGNOSIS — I110 Hypertensive heart disease with heart failure: Secondary | ICD-10-CM | POA: Diagnosis not present

## 2018-05-24 DIAGNOSIS — M2550 Pain in unspecified joint: Secondary | ICD-10-CM | POA: Diagnosis not present

## 2018-05-24 DIAGNOSIS — R5382 Chronic fatigue, unspecified: Secondary | ICD-10-CM | POA: Diagnosis not present

## 2018-06-22 DIAGNOSIS — I110 Hypertensive heart disease with heart failure: Secondary | ICD-10-CM | POA: Diagnosis not present

## 2018-06-22 DIAGNOSIS — R5382 Chronic fatigue, unspecified: Secondary | ICD-10-CM | POA: Diagnosis not present

## 2018-07-22 DIAGNOSIS — I110 Hypertensive heart disease with heart failure: Secondary | ICD-10-CM | POA: Diagnosis not present

## 2018-07-22 DIAGNOSIS — R5382 Chronic fatigue, unspecified: Secondary | ICD-10-CM | POA: Diagnosis not present

## 2018-07-22 DIAGNOSIS — M2550 Pain in unspecified joint: Secondary | ICD-10-CM | POA: Diagnosis not present

## 2018-07-28 DIAGNOSIS — N529 Male erectile dysfunction, unspecified: Secondary | ICD-10-CM | POA: Diagnosis not present

## 2018-07-28 DIAGNOSIS — R5382 Chronic fatigue, unspecified: Secondary | ICD-10-CM | POA: Diagnosis not present

## 2018-07-28 DIAGNOSIS — N400 Enlarged prostate without lower urinary tract symptoms: Secondary | ICD-10-CM | POA: Diagnosis not present

## 2018-07-28 DIAGNOSIS — I110 Hypertensive heart disease with heart failure: Secondary | ICD-10-CM | POA: Diagnosis not present

## 2018-08-19 DIAGNOSIS — F418 Other specified anxiety disorders: Secondary | ICD-10-CM | POA: Diagnosis not present

## 2018-08-19 DIAGNOSIS — R5382 Chronic fatigue, unspecified: Secondary | ICD-10-CM | POA: Diagnosis not present

## 2018-08-19 DIAGNOSIS — I110 Hypertensive heart disease with heart failure: Secondary | ICD-10-CM | POA: Diagnosis not present

## 2018-08-19 DIAGNOSIS — M2550 Pain in unspecified joint: Secondary | ICD-10-CM | POA: Diagnosis not present

## 2018-09-16 DIAGNOSIS — N400 Enlarged prostate without lower urinary tract symptoms: Secondary | ICD-10-CM | POA: Diagnosis not present

## 2018-09-16 DIAGNOSIS — N5202 Corporo-venous occlusive erectile dysfunction: Secondary | ICD-10-CM | POA: Diagnosis not present

## 2018-09-16 DIAGNOSIS — R5382 Chronic fatigue, unspecified: Secondary | ICD-10-CM | POA: Diagnosis not present

## 2018-09-16 DIAGNOSIS — I110 Hypertensive heart disease with heart failure: Secondary | ICD-10-CM | POA: Diagnosis not present

## 2018-10-12 DIAGNOSIS — F418 Other specified anxiety disorders: Secondary | ICD-10-CM | POA: Diagnosis not present

## 2018-10-12 DIAGNOSIS — M2550 Pain in unspecified joint: Secondary | ICD-10-CM | POA: Diagnosis not present

## 2020-10-09 ENCOUNTER — Encounter (HOSPITAL_COMMUNITY)
Admission: RE | Admit: 2020-10-09 | Discharge: 2020-10-09 | Disposition: A | Discharge status: Home / Self Care | Payer: Medicare HMO | Source: Ambulatory Visit | Attending: Ophthalmology | Admitting: Ophthalmology

## 2020-10-09 ENCOUNTER — Other Ambulatory Visit: Payer: Self-pay

## 2020-10-09 NOTE — H&P (Signed)
Surgical History & Physical  Patient Name: Jermaine Gonzalez DOB: 10/15/1950  Surgery: Cataract extraction with intraocular lens implant phacoemulsification; Right Eye  Surgeon: Baruch Goldmann MD Surgery Date:  10/15/2020 Pre-Op Date:  10/08/2020  HPI: A 30 Yr. old male patient is referred by Dr Jorja Loa for cataract eval. 1. The patient complains of difficulty when driving, which began 6 months ago. Both eyes are affected. The episode is gradual. Symptoms occur when the patient is driving, inside and outside. This is negatively affecting his quality of life. HPI was performed by Baruch Goldmann .  Medical History: Cataracts Anxiety Disorder, Cerebrovascular accident 2020  Review of Systems Negative Allergic/Immunologic Negative Cardiovascular Negative Constitutional Negative Ear, Nose, Mouth & Throat Negative Endocrine Negative Eyes Negative Gastrointestinal Negative Genitourinary Negative Hemotologic/Lymphatic Negative Integumentary Negative Musculoskeletal Negative Neurological Negative Psychiatry Negative Respiratory  Social   Current every day smoker   Medication Xanax,   Sx/Procedures Leg, Below knee amputation,   Drug Allergies   NKDA  History & Physical: Heent:  Cataract, Right eye NECK: supple without bruits LUNGS: lungs clear to auscultation CV: regular rate and rhythm Abdomen: soft and non-tender  Impression & Plan: Assessment: 1.  NUCLEAR SCLEROSIS AGE RELATED; Both Eyes (H25.13) 2.  Hyperopia ; Both Eyes (H52.03) 3.  BLEPHARITIS; Right Upper Lid, Right Lower Lid, Left Upper Lid, Left Lower Lid (H01.001, H01.002,H01.004,H01.005) 4.  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  Cataract accounts for the patient's decreased vision. This visual impairment is not correctable with a tolerable change in glasses or contact lenses. Cataract surgery with an implantation of a new lens should significantly improve the visual and functional status of the  patient. Discussed all risks, benefits, alternatives, and potential complications. Discussed the procedures and recovery. Patient desires to have surgery. A-scan ordered and performed today for intra-ocular lens calculations. The surgery will be performed in order to improve vision for driving, reading, and for eye examinations. Recommend phacoemulsification with intra-ocular lens. Recommend Dextenza for post-operative pain and inflammation. Right Eye worse - first. Dilates well - shugarcaine by protocol. 2.  3.  Regular lid cleaning. 4.  Few small drusen. Monitor. Call with any worsening vision, pain, or any other concerns.

## 2020-10-11 ENCOUNTER — Encounter (HOSPITAL_COMMUNITY): Payer: Self-pay | Admitting: Ophthalmology

## 2020-10-12 ENCOUNTER — Other Ambulatory Visit: Payer: Self-pay

## 2020-10-12 ENCOUNTER — Other Ambulatory Visit (HOSPITAL_COMMUNITY)
Admission: RE | Admit: 2020-10-12 | Discharge: 2020-10-12 | Disposition: A | Discharge status: Home / Self Care | Payer: Medicare HMO | Source: Ambulatory Visit | Attending: Ophthalmology | Admitting: Ophthalmology

## 2020-10-12 DIAGNOSIS — Z01812 Encounter for preprocedural laboratory examination: Secondary | ICD-10-CM | POA: Insufficient documentation

## 2020-10-12 DIAGNOSIS — Z20822 Contact with and (suspected) exposure to covid-19: Secondary | ICD-10-CM | POA: Diagnosis not present

## 2020-10-13 LAB — SARS CORONAVIRUS 2 (TAT 6-24 HRS): SARS Coronavirus 2: NEGATIVE

## 2020-10-15 ENCOUNTER — Ambulatory Visit (HOSPITAL_COMMUNITY): Payer: Medicare HMO | Admitting: Anesthesiology

## 2020-10-15 ENCOUNTER — Encounter (HOSPITAL_COMMUNITY)
Admission: RE | Disposition: A | Discharge status: Home / Self Care | Payer: Self-pay | Source: Home / Self Care | Attending: Ophthalmology

## 2020-10-15 ENCOUNTER — Encounter (HOSPITAL_COMMUNITY): Payer: Self-pay | Admitting: Ophthalmology

## 2020-10-15 ENCOUNTER — Ambulatory Visit (HOSPITAL_COMMUNITY)
Admission: RE | Admit: 2020-10-15 | Discharge: 2020-10-15 | Disposition: A | Discharge status: Home / Self Care | Payer: Medicare HMO | Attending: Ophthalmology | Admitting: Ophthalmology

## 2020-10-15 DIAGNOSIS — Z8673 Personal history of transient ischemic attack (TIA), and cerebral infarction without residual deficits: Secondary | ICD-10-CM | POA: Insufficient documentation

## 2020-10-15 DIAGNOSIS — H5203 Hypermetropia, bilateral: Secondary | ICD-10-CM | POA: Diagnosis not present

## 2020-10-15 DIAGNOSIS — H2513 Age-related nuclear cataract, bilateral: Secondary | ICD-10-CM | POA: Insufficient documentation

## 2020-10-15 DIAGNOSIS — F17200 Nicotine dependence, unspecified, uncomplicated: Secondary | ICD-10-CM | POA: Diagnosis not present

## 2020-10-15 DIAGNOSIS — H0100B Unspecified blepharitis left eye, upper and lower eyelids: Secondary | ICD-10-CM | POA: Diagnosis not present

## 2020-10-15 DIAGNOSIS — H0100A Unspecified blepharitis right eye, upper and lower eyelids: Secondary | ICD-10-CM | POA: Diagnosis not present

## 2020-10-15 DIAGNOSIS — H2511 Age-related nuclear cataract, right eye: Secondary | ICD-10-CM | POA: Diagnosis present

## 2020-10-15 HISTORY — PX: CATARACT EXTRACTION W/PHACO: SHX586

## 2020-10-15 SURGERY — PHACOEMULSIFICATION, CATARACT, WITH IOL INSERTION
Anesthesia: Monitor Anesthesia Care | Site: Eye | Laterality: Right

## 2020-10-15 MED ORDER — LIDOCAINE HCL (PF) 1 % IJ SOLN
INTRAOCULAR | Status: DC | PRN
  Administered 2020-10-15: 1 mL via OPHTHALMIC

## 2020-10-15 MED ORDER — LIDOCAINE HCL 3.5 % OP GEL
1.0000 "application " | Freq: Once | OPHTHALMIC | Status: AC
  Administered 2020-10-15: 1 via OPHTHALMIC

## 2020-10-15 MED ORDER — EPINEPHRINE PF 1 MG/ML IJ SOLN
INTRAMUSCULAR | Status: AC
  Filled 2020-10-15: qty 2

## 2020-10-15 MED ORDER — POVIDONE-IODINE 5 % OP SOLN
OPHTHALMIC | Status: DC | PRN
  Administered 2020-10-15: 1 via OPHTHALMIC

## 2020-10-15 MED ORDER — NEOMYCIN-POLYMYXIN-DEXAMETH 3.5-10000-0.1 OP SUSP
OPHTHALMIC | Status: DC | PRN
  Administered 2020-10-15: 1 [drp] via OPHTHALMIC

## 2020-10-15 MED ORDER — SODIUM HYALURONATE 23 MG/ML IO SOLN
INTRAOCULAR | Status: DC | PRN
  Administered 2020-10-15: 0.6 mL via INTRAOCULAR

## 2020-10-15 MED ORDER — PROVISC 10 MG/ML IO SOLN
INTRAOCULAR | Status: DC | PRN
  Administered 2020-10-15: 0.85 mL via INTRAOCULAR

## 2020-10-15 MED ORDER — PHENYLEPHRINE HCL 2.5 % OP SOLN
1.0000 [drp] | OPHTHALMIC | Status: AC | PRN
  Administered 2020-10-15 (×3): 1 [drp] via OPHTHALMIC

## 2020-10-15 MED ORDER — TETRACAINE HCL 0.5 % OP SOLN
1.0000 [drp] | OPHTHALMIC | Status: AC | PRN
  Administered 2020-10-15 (×3): 1 [drp] via OPHTHALMIC

## 2020-10-15 MED ORDER — EPINEPHRINE PF 1 MG/ML IJ SOLN
INTRAOCULAR | Status: DC | PRN
  Administered 2020-10-15: 500 mL

## 2020-10-15 MED ORDER — TROPICAMIDE 1 % OP SOLN
1.0000 [drp] | OPHTHALMIC | Status: AC
  Administered 2020-10-15 (×3): 1 [drp] via OPHTHALMIC

## 2020-10-15 MED ORDER — BSS IO SOLN
INTRAOCULAR | Status: DC | PRN
  Administered 2020-10-15: 15 mL

## 2020-10-15 MED ORDER — STERILE WATER FOR IRRIGATION IR SOLN
Status: DC | PRN
  Administered 2020-10-15: 250 mL

## 2020-10-15 SURGICAL SUPPLY — 13 items
CLOTH BEACON ORANGE TIMEOUT ST (SAFETY) ×2 IMPLANT
EYE SHIELD UNIVERSAL CLEAR (GAUZE/BANDAGES/DRESSINGS) ×2 IMPLANT
GLOVE SURG UNDER POLY LF SZ6.5 (GLOVE) ×4 IMPLANT
GLOVE SURG UNDER POLY LF SZ7 (GLOVE) ×2 IMPLANT
GOWN STRL REUS W/TWL LRG LVL3 (GOWN DISPOSABLE) ×2 IMPLANT
NEEDLE HYPO 18GX1.5 BLUNT FILL (NEEDLE) ×2 IMPLANT
PAD ARMBOARD 7.5X6 YLW CONV (MISCELLANEOUS) ×2 IMPLANT
SYR TB 1ML LL NO SAFETY (SYRINGE) ×2 IMPLANT
TAPE SURG TRANSPORE 1 IN (GAUZE/BANDAGES/DRESSINGS) ×1 IMPLANT
TAPE SURGICAL TRANSPORE 1 IN (GAUZE/BANDAGES/DRESSINGS) ×2
TECNIS 1 PIECE IOL (Intraocular Lens) ×2 IMPLANT
VISCOELASTIC ADDITIONAL (OPHTHALMIC RELATED) IMPLANT
WATER STERILE IRR 250ML POUR (IV SOLUTION) ×2 IMPLANT

## 2020-10-15 NOTE — Anesthesia Postprocedure Evaluation (Signed)
Anesthesia Post Note  Patient: Jermaine Gonzalez  Procedure(s) Performed: CATARACT EXTRACTION PHACO AND INTRAOCULAR LENS PLACEMENT RIGHT EYE (Right Eye)  Patient location during evaluation: Phase II Anesthesia Type: MAC Level of consciousness: awake and alert and oriented Pain management: pain level controlled Vital Signs Assessment: post-procedure vital signs reviewed and stable Respiratory status: spontaneous breathing and respiratory function stable Cardiovascular status: blood pressure returned to baseline and stable Postop Assessment: no apparent nausea or vomiting Anesthetic complications: no   No complications documented.   Last Vitals:  Vitals:   10/15/20 0958 10/15/20 1051  BP: (!) 160/76 (!) 190/80  Pulse:  79  Resp: (!) 8 18  Temp: 36.6 C 36.4 C  SpO2: 99% 98%    Last Pain:  Vitals:   10/15/20 1051  TempSrc: Oral  PainSc: 0-No pain                 Evens Meno C Katelinn Justice

## 2020-10-15 NOTE — Anesthesia Procedure Notes (Addendum)
Procedure Name: MAC Date/Time: 10/15/2020 10:41 AM Performed by: Orlie Dakin, CRNA Pre-anesthesia Checklist: Patient identified, Emergency Drugs available, Suction available and Patient being monitored Patient Re-evaluated:Patient Re-evaluated prior to induction Oxygen Delivery Method: Nasal cannula Placement Confirmation: positive ETCO2

## 2020-10-15 NOTE — Op Note (Addendum)
Date of procedure: 10/15/20  Pre-operative diagnosis: Visually significant age-related nuclear cataract, Right Eye (H25.11)  Post-operative diagnosis: Visually significant age-related nuclear cataract, Right Eye  Procedure: Removal of cataract via phacoemulsification and insertion of intra-ocular lens Wynetta Emery and [place]  +25.5D into the capsular bag of the Right Eye  Attending surgeon: Gerda Diss. Breckon Reeves, MD, MA  Anesthesia: MAC, Topical Akten  Complications: None  Estimated Blood Loss: <50m (minimal)  Specimens: None  Implants: As above  Indications:  Visually significant age-related cataract, Right Eye  Procedure:  The patient was seen and identified in the pre-operative area. The operative eye was identified and dilated.  The operative eye was marked.  Topical anesthesia was administered to the operative eye.     The patient was then to the operative suite and placed in the supine position.  A timeout was performed confirming the patient, procedure to be performed, and all other relevant information.   The patient's face was prepped and draped in the usual fashion for intra-ocular surgery.  A lid speculum was placed into the operative eye and the surgical microscope moved into place and focused.  A superotemporal paracentesis was created using a 20 gauge paracentesis blade.  Shugarcaine was injected into the anterior chamber.  Viscoelastic was injected into the anterior chamber.  A temporal clear-corneal main wound incision was created using a 2.420mmicrokeratome.  A continuous curvilinear capsulorrhexis was initiated using an irrigating cystitome and completed using capsulorrhexis forceps.  Hydrodissection and hydrodeliniation were performed.  Viscoelastic was injected into the anterior chamber.  A phacoemulsification handpiece and a chopper as a second instrument were used to remove the nucleus and epinucleus. The irrigation/aspiration handpiece was used to remove any  remaining cortical material.   The capsular bag was reinflated with viscoelastic, checked, and found to be intact.  The intraocular lens was inserted into the capsular bag.  The irrigation/aspiration handpiece was used to remove any remaining viscoelastic.  The clear corneal wound and paracentesis wounds were then hydrated and checked with Weck-Cels to be watertight.  The lid-speculum and drape was removed, and the patient's face was cleaned with a wet and dry 4x4.  Maxitrol was instilled in the eye before a clear shield was taped over the eye. The patient was taken to the post-operative care unit in good condition, having tolerated the procedure well.  Post-Op Instructions: [facility] and will receive all other orders and instructions.

## 2020-10-15 NOTE — Anesthesia Preprocedure Evaluation (Signed)
Anesthesia Evaluation  Patient identified by MRN, date of birth, ID band Patient awake    Reviewed: Allergy & Precautions, NPO status , Patient's Chart, lab work & pertinent test results  History of Anesthesia Complications Negative for: history of anesthetic complications  Airway Mallampati: II  TM Distance: >3 FB Neck ROM: Full    Dental  (+) Dental Advisory Given, Missing   Pulmonary asthma , COPD, former smoker,    Pulmonary exam normal breath sounds clear to auscultation       Cardiovascular Exercise Tolerance: Good + Peripheral Vascular Disease  Normal cardiovascular exam Rhythm:Regular Rate:Normal     Neuro/Psych PSYCHIATRIC DISORDERS Anxiety    GI/Hepatic negative GI ROS, (+)     substance abuse (benzodiazepine dependence)  alcohol use, Hepatitis -, C  Endo/Other  negative endocrine ROS  Renal/GU negative Renal ROS     Musculoskeletal Right BKA   Abdominal   Peds  Hematology negative hematology ROS (+)   Anesthesia Other Findings Right BKA  Reproductive/Obstetrics negative OB ROS                            Anesthesia Physical Anesthesia Plan  ASA: III  Anesthesia Plan: MAC   Post-op Pain Management:    Induction:   PONV Risk Score and Plan:   Airway Management Planned: Natural Airway and Nasal Cannula  Additional Equipment:   Intra-op Plan:   Post-operative Plan:   Informed Consent: I have reviewed the patients History and Physical, chart, labs and discussed the procedure including the risks, benefits and alternatives for the proposed anesthesia with the patient or authorized representative who has indicated his/her understanding and acceptance.     Dental advisory given  Plan Discussed with: CRNA and Surgeon  Anesthesia Plan Comments:        Anesthesia Quick Evaluation

## 2020-10-15 NOTE — Transfer of Care (Signed)
Immediate Anesthesia Transfer of Care Note  Patient: Jermaine Gonzalez  Procedure(s) Performed: CATARACT EXTRACTION PHACO AND INTRAOCULAR LENS PLACEMENT RIGHT EYE (Right Eye)  Patient Location: Short Stay  Anesthesia Type:MAC  Level of Consciousness: awake, alert  and oriented  Airway & Oxygen Therapy: Patient Spontanous Breathing  Post-op Assessment: Report given to RN and Post -op Vital signs reviewed and stable  Post vital signs: Reviewed and stable  Last Vitals:  Vitals Value Taken Time  BP    Temp    Pulse    Resp    SpO2      Last Pain:  Vitals:   10/15/20 0958  TempSrc: Oral  PainSc: 0-No pain      Patients Stated Pain Goal: 8 (18/29/93 7169)  Complications: No complications documented.

## 2020-10-15 NOTE — Interval H&P Note (Signed)
History and Physical Interval Note:  10/15/2020 10:34 AM  Jermaine Gonzalez  has presented today for surgery, with the diagnosis of Nuclear sclerotic cataract - Right eye.  The various methods of treatment have been discussed with the patient and family. After consideration of risks, benefits and other options for treatment, the patient has consented to  Procedure(s) with comments: CATARACT EXTRACTION PHACO AND INTRAOCULAR LENS PLACEMENT ([place]) (Right) - right CDE as a surgical intervention.  The patient's history has been reviewed, patient examined, no change in status, stable for surgery.  I have reviewed the patient's chart and labs.  Questions were answered to the patient's satisfaction.     Baruch Goldmann

## 2020-10-15 NOTE — Discharge Instructions (Addendum)
Please discharge patient when stable, will follow up today with Dr. Wrzosek at the [facility] office immediately following discharge.  Leave shield in place until visit.  All paperwork with discharge instructions will be given at the office.  [facility] [place] Address:  [address]  [place], [place] [address]  

## 2020-10-16 ENCOUNTER — Encounter (HOSPITAL_COMMUNITY): Payer: Self-pay | Admitting: Ophthalmology

## 2020-10-18 NOTE — Patient Instructions (Signed)
Jermaine Gonzalez  10/18/2020     @PREFPERIOPPHARMACY @   Your procedure is scheduled on  10/29/2020.   Report to [facility] at  0900  A.M.   Call this number if you have problems the morning of surgery:  [phone]   Remember:  Do not eat or drink after midnight.                          Take these medicines the morning of surgery with A SIP OF WATER amlodipine     Please brush your teeth.  Do not wear jewelry, make-up or nail polish.  Do not wear lotions, powders, or perfumes, or deodorant.  Do not shave 48 hours prior to surgery.  Men may shave face and neck.  Do not bring valuables to the hospital.  [facility] is not responsible for any belongings or valuables.  Contacts, dentures or bridgework may not be worn into surgery.  Leave your suitcase in the car.  After surgery it may be brought to your room.  For patients admitted to the hospital, discharge time will be determined by your treatment team.  Patients discharged the day of surgery will not be allowed to drive home and must have someone with them for 24 hours.   Special instructions:  DO NOT smoke tobacco or vape for 24 hours before your procedure.   Please read over the following fact sheets that you were given. Anesthesia Post-op Instructions and Care and Recovery After Surgery      Cataract Surgery, Care After This sheet gives you information about how to care for yourself after your procedure. Your health care provider may also give you more specific instructions. If you have problems or questions, contact your health care provider. What can I expect after the procedure? After the procedure, it is common to have:  Itching.  Discomfort.  Fluid discharge.  Sensitivity to light and to touch.  Bruising in or around the eye.  Mild blurred vision. Follow these instructions at home: Eye care  Do not touch or rub your eyes.  Protect your eyes as told by your health care provider. You may be  told to wear a protective eye shield or sunglasses.  Do not put a contact lens into the affected eye or eyes until your health care provider approves.  Keep the area around your eye clean and dry: ? Avoid swimming. ? Do not allow water to hit you directly in the face while showering. ? Keep soap and shampoo out of your eyes.  Check your eye every day for signs of infection. Watch for: ? Redness, swelling, or pain. ? Fluid, blood, or pus. ? Warmth. ? A bad smell. ? Vision that is getting worse. ? Sensitivity that is getting worse.   Activity  Do not drive for 24 hours if you were given a sedative during your procedure.  Avoid strenuous activities, such as playing contact sports, for as long as told by your health care provider.  Do not drive or use heavy machinery until your health care provider approves.  Do not bend or lift heavy objects. Bending increases pressure in the eye. You can walk, climb stairs, and do light household chores.  Ask your health care provider when you can return to work. If you work in a dusty environment, you may be advised to wear protective eyewear for a period of time. General instructions  Take or apply over-the-counter  and prescription medicines only as told by your health care provider. This includes eye drops.  Keep all follow-up visits as told by your health care provider. This is important. Contact a health care provider if:  You have increased bruising around your eye.  You have pain that is not helped with medicine.  You have a fever.  You have redness, swelling, or pain in your eye.  You have fluid, blood, or pus coming from your incision.  Your vision gets worse.  Your sensitivity to light gets worse. Get help right away if:  You have sudden loss of vision.  You see flashes of light or spots (floaters).  You have severe eye pain.  You develop nausea or vomiting. Summary  After your procedure, it is common to have itching,  discomfort, bruising, fluid discharge, or sensitivity to light.  Follow instructions from your health care provider about caring for your eye after the procedure.  Do not rub your eye after the procedure. You may need to wear eye protection or sunglasses. Do not wear contact lenses. Keep the area around your eye clean and dry.  Avoid activities that require a lot of effort. These include playing sports and lifting heavy objects.  Contact a health care provider if you have increased bruising, pain that does not go away, or a fever. Get help right away if you suddenly lose your vision, see flashes of light or spots, or have severe pain in the eye. This information is not intended to replace advice given to you by your health care provider. Make sure you discuss any questions you have with your health care provider. Document Revised: 04/19/2019 Document Reviewed: 12/21/2017 Elsevier Patient Education  [place].

## 2020-10-23 ENCOUNTER — Encounter (HOSPITAL_COMMUNITY)
Admit: 2020-10-23 | Discharge: 2020-10-23 | Disposition: A | Discharge status: Home / Self Care | Payer: Medicare HMO | Attending: Ophthalmology | Admitting: Ophthalmology

## 2020-10-23 ENCOUNTER — Encounter (HOSPITAL_COMMUNITY): Payer: Self-pay

## 2020-10-23 ENCOUNTER — Other Ambulatory Visit: Payer: Self-pay

## 2020-10-23 NOTE — H&P (Signed)
Surgical History & Physical  Patient Name: Jermaine Gonzalez DOB: 23-Jan-1951  Surgery: Cataract extraction with intraocular lens implant phacoemulsification; Left Eye  Surgeon: Baruch Goldmann MD Surgery Date:  10/29/2020 Pre-Op Date:  10/22/2020  HPI: A 38 Yr. old male patient Pt presents for POW1 s/p Phaco w/IOL OD, pre op OS. Pt notes brighter/clearer VA OD, so far. Pt denies discomfort. Pt notes good compliance with Prednisolone OD bid Moxifloxacin OD tid and Ilevro OD qd. Pt notes continued hazy VA OS, making driving difficult. This is negatively affecting the patient's quality of life. Pt denies discomfort. Pt notes wishes to proceed with CE OS, as planned. HPI Completed by Dr. Baruch Goldmann  Medical History: Cataracts Anxiety Disorder, Cerebrovascular accident 2020  Review of Systems Negative Allergic/Immunologic Negative Cardiovascular Negative Constitutional Negative Ear, Nose, Mouth & Throat Negative Endocrine Negative Eyes Negative Gastrointestinal Negative Genitourinary Negative Hemotologic/Lymphatic Negative Integumentary Negative Musculoskeletal Negative Neurological Negative Psychiatry Negative Respiratory  Social   Current every day smoker  Medication Ilevro, Moxifloxacin, Prednisolone,  Xanax,   Sx/Procedures Phaco c IOL OD,  Leg, Below knee amputation,   Drug Allergies   NKDA  History & Physical: Heent:  Cataract, Left eye NECK: supple without bruits LUNGS: lungs clear to auscultation CV: regular rate and rhythm Abdomen: soft and non-tender  Impression & Plan: Assessment: 1.  CATARACT EXTRACTION STATUS; Right Eye (Z98.41) 2.  INTRAOCULAR LENS IOL (Z96.1) 3.  NUCLEAR SCLEROSIS AGE RELATED; , Left Eye (H25.12)  Plan: 1.  1 week after cataract surgery. Doing well with improved vision and normal eye pressure. Call with any problems or concerns. Stop Vigamox. Continue Ilevro 1 drop 1x/day for 3 more weeks. Continue Pred Acetate 1 drop 2x/day for 3  more weeks. 2.  Doing well since surgery Continue Post-op medications 3.  Dilates well - shugarcaine by protocol. Cataract accounts for the patient's decreased vision. This visual impairment is not correctable with a tolerable change in glasses or contact lenses. Cataract surgery with an implantation of a new lens should significantly improve the visual and functional status of the patient. Discussed all risks, benefits, alternatives, and potential complications. Discussed the procedures and recovery. Patient desires to have surgery. A-scan ordered and performed today for intra-ocular lens calculations. The surgery will be performed in order to improve vision for driving, reading, and for eye examinations. Recommend phacoemulsification with intra-ocular lens. Recommend Dextenza for post-operative pain and inflammation. Left Eye. Surgery required to correct imbalance of vision.

## 2020-10-26 ENCOUNTER — Other Ambulatory Visit: Payer: Self-pay

## 2020-10-26 ENCOUNTER — Other Ambulatory Visit (HOSPITAL_COMMUNITY)
Admission: RE | Admit: 2020-10-26 | Discharge: 2020-10-26 | Disposition: A | Discharge status: Home / Self Care | Payer: Medicare HMO | Source: Ambulatory Visit | Attending: Ophthalmology | Admitting: Ophthalmology

## 2020-10-26 DIAGNOSIS — Z01812 Encounter for preprocedural laboratory examination: Secondary | ICD-10-CM | POA: Insufficient documentation

## 2020-10-26 DIAGNOSIS — Z20822 Contact with and (suspected) exposure to covid-19: Secondary | ICD-10-CM | POA: Diagnosis not present

## 2020-10-27 LAB — SARS CORONAVIRUS 2 (TAT 6-24 HRS): SARS Coronavirus 2: NEGATIVE

## 2020-10-29 ENCOUNTER — Ambulatory Visit (HOSPITAL_COMMUNITY)
Admission: RE | Admit: 2020-10-29 | Discharge: 2020-10-29 | Disposition: A | Discharge status: Home / Self Care | Payer: Medicare HMO | Attending: Ophthalmology | Admitting: Ophthalmology

## 2020-10-29 ENCOUNTER — Other Ambulatory Visit: Payer: Self-pay

## 2020-10-29 ENCOUNTER — Ambulatory Visit (HOSPITAL_COMMUNITY): Payer: Medicare HMO | Admitting: Anesthesiology

## 2020-10-29 ENCOUNTER — Encounter (HOSPITAL_COMMUNITY): Payer: Self-pay | Admitting: Ophthalmology

## 2020-10-29 ENCOUNTER — Encounter (HOSPITAL_COMMUNITY)
Admission: RE | Disposition: A | Discharge status: Home / Self Care | Payer: Self-pay | Source: Home / Self Care | Attending: Ophthalmology

## 2020-10-29 DIAGNOSIS — Z8673 Personal history of transient ischemic attack (TIA), and cerebral infarction without residual deficits: Secondary | ICD-10-CM | POA: Insufficient documentation

## 2020-10-29 DIAGNOSIS — Z9841 Cataract extraction status, right eye: Secondary | ICD-10-CM | POA: Insufficient documentation

## 2020-10-29 DIAGNOSIS — H2512 Age-related nuclear cataract, left eye: Secondary | ICD-10-CM | POA: Insufficient documentation

## 2020-10-29 DIAGNOSIS — F17200 Nicotine dependence, unspecified, uncomplicated: Secondary | ICD-10-CM | POA: Insufficient documentation

## 2020-10-29 DIAGNOSIS — Z961 Presence of intraocular lens: Secondary | ICD-10-CM | POA: Insufficient documentation

## 2020-10-29 HISTORY — PX: CATARACT EXTRACTION W/PHACO: SHX586

## 2020-10-29 SURGERY — PHACOEMULSIFICATION, CATARACT, WITH IOL INSERTION
Anesthesia: Monitor Anesthesia Care | Site: Eye | Laterality: Left

## 2020-10-29 MED ORDER — SODIUM HYALURONATE 23MG/ML IO SOSY
PREFILLED_SYRINGE | INTRAOCULAR | Status: DC | PRN
  Administered 2020-10-29: 0.6 mL via INTRAOCULAR

## 2020-10-29 MED ORDER — POVIDONE-IODINE 5 % OP SOLN
OPHTHALMIC | Status: DC | PRN
  Administered 2020-10-29: 1 via OPHTHALMIC

## 2020-10-29 MED ORDER — NEOMYCIN-POLYMYXIN-DEXAMETH 3.5-10000-0.1 OP SUSP
OPHTHALMIC | Status: DC | PRN
  Administered 2020-10-29: 1 [drp] via OPHTHALMIC

## 2020-10-29 MED ORDER — TETRACAINE HCL 0.5 % OP SOLN
1.0000 [drp] | OPHTHALMIC | Status: AC | PRN
  Administered 2020-10-29 (×3): 1 [drp] via OPHTHALMIC

## 2020-10-29 MED ORDER — PHENYLEPHRINE HCL 2.5 % OP SOLN
1.0000 [drp] | OPHTHALMIC | Status: AC | PRN
  Administered 2020-10-29 (×3): 1 [drp] via OPHTHALMIC

## 2020-10-29 MED ORDER — STERILE WATER FOR IRRIGATION IR SOLN
Status: DC | PRN
  Administered 2020-10-29: 250 mL

## 2020-10-29 MED ORDER — LIDOCAINE HCL 3.5 % OP GEL: Freq: Once | OPHTHALMIC | Status: AC

## 2020-10-29 MED ORDER — TROPICAMIDE 1 % OP SOLN
1.0000 [drp] | OPHTHALMIC | Status: AC
  Administered 2020-10-29 (×3): 1 [drp] via OPHTHALMIC

## 2020-10-29 MED ORDER — BSS IO SOLN
INTRAOCULAR | Status: DC | PRN
  Administered 2020-10-29: 15 mL

## 2020-10-29 MED ORDER — EPINEPHRINE PF 1 MG/ML IJ SOLN
INTRAOCULAR | Status: DC | PRN
  Administered 2020-10-29: 500 mL

## 2020-10-29 MED ORDER — EPINEPHRINE PF 1 MG/ML IJ SOLN
INTRAMUSCULAR | Status: AC
  Filled 2020-10-29: qty 2

## 2020-10-29 MED ORDER — LIDOCAINE HCL (PF) 1 % IJ SOLN
INTRAOCULAR | Status: DC | PRN
  Administered 2020-10-29: 1 mL via OPHTHALMIC

## 2020-10-29 MED ORDER — SODIUM HYALURONATE 10 MG/ML IO SOLUTION
PREFILLED_SYRINGE | INTRAOCULAR | Status: DC | PRN
  Administered 2020-10-29: 0.85 mL via INTRAOCULAR

## 2020-10-29 SURGICAL SUPPLY — 11 items
CLOTH BEACON ORANGE TIMEOUT ST (SAFETY) ×2 IMPLANT
EYE SHIELD UNIVERSAL CLEAR (GAUZE/BANDAGES/DRESSINGS) ×2 IMPLANT
GLOVE SURG UNDER POLY LF SZ7 (GLOVE) ×4 IMPLANT
NEEDLE HYPO 18GX1.5 BLUNT FILL (NEEDLE) ×2 IMPLANT
PAD ARMBOARD 7.5X6 YLW CONV (MISCELLANEOUS) ×2 IMPLANT
SYR TB 1ML LL NO SAFETY (SYRINGE) ×2 IMPLANT
TAPE SURG TRANSPORE 1 IN (GAUZE/BANDAGES/DRESSINGS) ×1 IMPLANT
TAPE SURGICAL TRANSPORE 1 IN (GAUZE/BANDAGES/DRESSINGS) ×2
TECNIS 1 PIECE IOL (Intraocular Lens) ×2 IMPLANT
VISCOELASTIC ADDITIONAL (OPHTHALMIC RELATED) IMPLANT
WATER STERILE IRR 250ML POUR (IV SOLUTION) ×2 IMPLANT

## 2020-10-29 NOTE — Discharge Instructions (Signed)
Please discharge patient when stable, will follow up today with Dr. Lelend Heinecke at the [facility] office immediately following discharge.  Leave shield in place until visit.  All paperwork with discharge instructions will be given at the office.  [facility] Address:  [address]  [place], [place] [address]  

## 2020-10-29 NOTE — Anesthesia Postprocedure Evaluation (Signed)
Anesthesia Post Note  Patient: Jermaine Gonzalez  Procedure(s) Performed: CATARACT EXTRACTION PHACO AND INTRAOCULAR LENS PLACEMENT LEFT EYE (Left Eye)  Patient location during evaluation: Short Stay Anesthesia Type: MAC Level of consciousness: awake and alert and oriented Pain management: pain level controlled Vital Signs Assessment: post-procedure vital signs reviewed and stable Respiratory status: spontaneous breathing Cardiovascular status: blood pressure returned to baseline and stable Postop Assessment: no apparent nausea or vomiting Anesthetic complications: no   No complications documented.   Last Vitals:  Vitals:   10/29/20 1017  BP: (!) 180/68  Pulse: 68  Resp: 14  Temp: 36.6 C  SpO2: 97%    Last Pain:  Vitals:   10/29/20 1017  TempSrc: Oral  PainSc: 0-No pain                 Saryna Kneeland

## 2020-10-29 NOTE — Anesthesia Preprocedure Evaluation (Signed)
Anesthesia Evaluation  Patient identified by MRN, date of birth, ID band Patient awake    Reviewed: Allergy & Precautions, NPO status , Patient's Chart, lab work & pertinent test results  History of Anesthesia Complications Negative for: history of anesthetic complications  Airway Mallampati: II  TM Distance: >3 FB Neck ROM: Full    Dental  (+) Dental Advisory Given, Missing   Pulmonary asthma , COPD, former smoker,    Pulmonary exam normal breath sounds clear to auscultation       Cardiovascular Exercise Tolerance: Good + Peripheral Vascular Disease  Normal cardiovascular exam Rhythm:Regular Rate:Normal     Neuro/Psych PSYCHIATRIC DISORDERS Anxiety    GI/Hepatic negative GI ROS, (+)     substance abuse (benzodiazepine dependence)  alcohol use, Hepatitis -, C  Endo/Other  negative endocrine ROS  Renal/GU negative Renal ROS     Musculoskeletal Right BKA   Abdominal   Peds  Hematology negative hematology ROS (+)   Anesthesia Other Findings Right BKA  Reproductive/Obstetrics negative OB ROS                             Anesthesia Physical  Anesthesia Plan  ASA: III  Anesthesia Plan: MAC   Post-op Pain Management:    Induction:   PONV Risk Score and Plan:   Airway Management Planned: Natural Airway and Nasal Cannula  Additional Equipment:   Intra-op Plan:   Post-operative Plan:   Informed Consent: I have reviewed the patients History and Physical, chart, labs and discussed the procedure including the risks, benefits and alternatives for the proposed anesthesia with the patient or authorized representative who has indicated his/her understanding and acceptance.     Dental advisory given  Plan Discussed with: CRNA and Surgeon  Anesthesia Plan Comments:         Anesthesia Quick Evaluation

## 2020-10-29 NOTE — Op Note (Signed)
Date of procedure: 10/29/20  Pre-operative diagnosis: Visually significant age-related nuclear cataract, Left Eye (H25.12)  Post-operative diagnosis: Visually significant age-related nuclear cataract, Left Eye  Procedure: Removal of cataract via phacoemulsification and insertion of intra-ocular lens [place]  +26.0D into the capsular bag of the Left Eye  Attending surgeon: Gerda Diss. Marirose Deveney, MD, MA  Anesthesia: MAC, Topical Akten  Complications: None  Estimated Blood Loss: <36m (minimal)  Specimens: None  Implants: As above  Indications:  Visually significant age-related cataract, Left Eye  Procedure:  The patient was seen and identified in the pre-operative area. The operative eye was identified and dilated.  The operative eye was marked.  Topical anesthesia was administered to the operative eye.     The patient was then to the operative suite and placed in the supine position.  A timeout was performed confirming the patient, procedure to be performed, and all other relevant information.   The patient's face was prepped and draped in the usual fashion for intra-ocular surgery.  A lid speculum was placed into the operative eye and the surgical microscope moved into place and focused.  An inferotemporal paracentesis was created using a 20 gauge paracentesis blade.  Shugarcaine was injected into the anterior chamber.  Viscoelastic was injected into the anterior chamber.  A temporal clear-corneal main wound incision was created using a 2.46mmicrokeratome.  A continuous curvilinear capsulorrhexis was initiated using an irrigating cystitome and completed using capsulorrhexis forceps.  Hydrodissection and hydrodeliniation were performed.  Viscoelastic was injected into the anterior chamber.  A phacoemulsification handpiece and a chopper as a second instrument were used to remove the nucleus and epinucleus. The irrigation/aspiration handpiece was used to remove any remaining  cortical material.   The capsular bag was reinflated with viscoelastic, checked, and found to be intact.  The intraocular lens was inserted into the capsular bag.  The irrigation/aspiration handpiece was used to remove any remaining viscoelastic.  The clear corneal wound and paracentesis wounds were then hydrated and checked with Weck-Cels to be watertight.  The lid-speculum and drape was removed, and the patient's face was cleaned with a wet and dry 4x4.  Maxitrol was instilled in the eye before a clear shield was taped over the eye. The patient was taken to the post-operative care unit in good condition, having tolerated the procedure well.  Post-Op Instructions: [facility] and will receive all other orders and instructions.

## 2020-10-29 NOTE — Transfer of Care (Signed)
Immediate Anesthesia Transfer of Care Note  Patient: Jermaine Gonzalez  Procedure(s) Performed: CATARACT EXTRACTION PHACO AND INTRAOCULAR LENS PLACEMENT LEFT EYE (Left Eye)  Patient Location: Short Stay  Anesthesia Type:MAC  Level of Consciousness: awake  Airway & Oxygen Therapy: Patient Spontanous Breathing  Post-op Assessment: Report given to RN  Post vital signs: Reviewed  Last Vitals:  Vitals Value Taken Time  BP    Temp    Pulse    Resp    SpO2      Last Pain:  Vitals:   10/29/20 1017  TempSrc: Oral  PainSc: 0-No pain      Patients Stated Pain Goal: 8 (81/84/03 7543)  Complications: No complications documented.

## 2020-10-29 NOTE — Interval H&P Note (Signed)
History and Physical Interval Note:  10/29/2020 10:48 AM  Jermaine Gonzalez  has presented today for surgery, with the diagnosis of Nuclear sclerotic cataract - Left eye.  The various methods of treatment have been discussed with the patient and family. After consideration of risks, benefits and other options for treatment, the patient has consented to  Procedure(s) with comments: CATARACT EXTRACTION PHACO AND INTRAOCULAR LENS PLACEMENT LEFT EYE (Left) - left as a surgical intervention.  The patient's history has been reviewed, patient examined, no change in status, stable for surgery.  I have reviewed the patient's chart and labs.  Questions were answered to the patient's satisfaction.     Baruch Goldmann

## 2020-10-31 ENCOUNTER — Encounter (HOSPITAL_COMMUNITY): Payer: Self-pay | Admitting: Ophthalmology

## 2021-02-12 ENCOUNTER — Other Ambulatory Visit (HOSPITAL_COMMUNITY): Payer: Self-pay | Admitting: Neurology

## 2021-02-12 ENCOUNTER — Other Ambulatory Visit: Payer: Self-pay | Admitting: Neurology

## 2021-02-12 DIAGNOSIS — I639 Cerebral infarction, unspecified: Secondary | ICD-10-CM

## 2021-02-26 ENCOUNTER — Other Ambulatory Visit: Payer: Self-pay

## 2021-02-26 ENCOUNTER — Ambulatory Visit (HOSPITAL_COMMUNITY)
Admission: RE | Admit: 2021-02-26 | Discharge: 2021-02-26 | Disposition: A | Discharge status: Home / Self Care | Payer: Medicare HMO | Source: Ambulatory Visit | Attending: Neurology | Admitting: Neurology

## 2021-02-26 DIAGNOSIS — I639 Cerebral infarction, unspecified: Secondary | ICD-10-CM | POA: Insufficient documentation

## 2021-04-17 ENCOUNTER — Other Ambulatory Visit (HOSPITAL_COMMUNITY): Payer: Self-pay | Admitting: Neurology

## 2021-04-17 DIAGNOSIS — I639 Cerebral infarction, unspecified: Secondary | ICD-10-CM

## 2021-04-22 ENCOUNTER — Other Ambulatory Visit: Payer: Self-pay

## 2021-04-22 ENCOUNTER — Ambulatory Visit (HOSPITAL_COMMUNITY)
Admission: RE | Admit: 2021-04-22 | Discharge: 2021-04-22 | Disposition: A | Discharge status: Home / Self Care | Payer: Medicare HMO | Source: Ambulatory Visit | Attending: Neurology | Admitting: Neurology

## 2021-04-22 DIAGNOSIS — I639 Cerebral infarction, unspecified: Secondary | ICD-10-CM | POA: Diagnosis present

## 2021-08-15 DIAGNOSIS — G546 Phantom limb syndrome with pain: Secondary | ICD-10-CM | POA: Diagnosis not present

## 2021-08-15 DIAGNOSIS — F1921 Other psychoactive substance dependence, in remission: Secondary | ICD-10-CM | POA: Diagnosis not present

## 2021-08-15 DIAGNOSIS — H6690 Otitis media, unspecified, unspecified ear: Secondary | ICD-10-CM | POA: Diagnosis not present

## 2021-08-15 DIAGNOSIS — I6930 Unspecified sequelae of cerebral infarction: Secondary | ICD-10-CM | POA: Diagnosis not present

## 2021-08-15 DIAGNOSIS — M79606 Pain in leg, unspecified: Secondary | ICD-10-CM | POA: Diagnosis not present

## 2021-08-15 DIAGNOSIS — F419 Anxiety disorder, unspecified: Secondary | ICD-10-CM | POA: Diagnosis not present

## 2021-08-15 DIAGNOSIS — F329 Major depressive disorder, single episode, unspecified: Secondary | ICD-10-CM | POA: Diagnosis not present

## 2021-08-21 DIAGNOSIS — Z2821 Immunization not carried out because of patient refusal: Secondary | ICD-10-CM | POA: Diagnosis not present

## 2021-08-21 DIAGNOSIS — F17210 Nicotine dependence, cigarettes, uncomplicated: Secondary | ICD-10-CM | POA: Diagnosis not present

## 2021-08-21 DIAGNOSIS — Z6821 Body mass index (BMI) 21.0-21.9, adult: Secondary | ICD-10-CM | POA: Diagnosis not present

## 2021-08-21 DIAGNOSIS — F419 Anxiety disorder, unspecified: Secondary | ICD-10-CM | POA: Diagnosis not present

## 2021-08-21 DIAGNOSIS — I10 Essential (primary) hypertension: Secondary | ICD-10-CM | POA: Diagnosis not present

## 2021-08-21 DIAGNOSIS — G629 Polyneuropathy, unspecified: Secondary | ICD-10-CM | POA: Diagnosis not present

## 2021-08-21 DIAGNOSIS — Z299 Encounter for prophylactic measures, unspecified: Secondary | ICD-10-CM | POA: Diagnosis not present

## 2021-09-18 DIAGNOSIS — F419 Anxiety disorder, unspecified: Secondary | ICD-10-CM | POA: Diagnosis not present

## 2021-09-18 DIAGNOSIS — Z299 Encounter for prophylactic measures, unspecified: Secondary | ICD-10-CM | POA: Diagnosis not present

## 2021-09-18 DIAGNOSIS — I10 Essential (primary) hypertension: Secondary | ICD-10-CM | POA: Diagnosis not present

## 2021-09-18 DIAGNOSIS — G629 Polyneuropathy, unspecified: Secondary | ICD-10-CM | POA: Diagnosis not present

## 2021-10-09 DIAGNOSIS — F419 Anxiety disorder, unspecified: Secondary | ICD-10-CM | POA: Diagnosis not present

## 2021-10-09 DIAGNOSIS — Z299 Encounter for prophylactic measures, unspecified: Secondary | ICD-10-CM | POA: Diagnosis not present

## 2021-10-09 DIAGNOSIS — G629 Polyneuropathy, unspecified: Secondary | ICD-10-CM | POA: Diagnosis not present

## 2021-10-09 DIAGNOSIS — I10 Essential (primary) hypertension: Secondary | ICD-10-CM | POA: Diagnosis not present

## 2021-10-10 ENCOUNTER — Other Ambulatory Visit (HOSPITAL_COMMUNITY): Payer: Self-pay | Admitting: Neurology

## 2021-10-10 DIAGNOSIS — G546 Phantom limb syndrome with pain: Secondary | ICD-10-CM | POA: Diagnosis not present

## 2021-10-10 DIAGNOSIS — M25521 Pain in right elbow: Secondary | ICD-10-CM

## 2021-10-10 DIAGNOSIS — J060 Acute laryngopharyngitis: Secondary | ICD-10-CM | POA: Diagnosis not present

## 2021-10-10 DIAGNOSIS — F419 Anxiety disorder, unspecified: Secondary | ICD-10-CM | POA: Diagnosis not present

## 2021-10-10 DIAGNOSIS — I10 Essential (primary) hypertension: Secondary | ICD-10-CM | POA: Diagnosis not present

## 2021-10-10 DIAGNOSIS — M79606 Pain in leg, unspecified: Secondary | ICD-10-CM | POA: Diagnosis not present

## 2021-10-10 DIAGNOSIS — Z299 Encounter for prophylactic measures, unspecified: Secondary | ICD-10-CM | POA: Diagnosis not present

## 2021-10-10 DIAGNOSIS — Z79899 Other long term (current) drug therapy: Secondary | ICD-10-CM | POA: Diagnosis not present

## 2021-10-10 DIAGNOSIS — M7031 Other bursitis of elbow, right elbow: Secondary | ICD-10-CM | POA: Diagnosis not present

## 2021-10-10 DIAGNOSIS — I6930 Unspecified sequelae of cerebral infarction: Secondary | ICD-10-CM | POA: Diagnosis not present

## 2021-11-12 DIAGNOSIS — R5383 Other fatigue: Secondary | ICD-10-CM | POA: Diagnosis not present

## 2021-11-12 DIAGNOSIS — Z1339 Encounter for screening examination for other mental health and behavioral disorders: Secondary | ICD-10-CM | POA: Diagnosis not present

## 2021-11-12 DIAGNOSIS — Z299 Encounter for prophylactic measures, unspecified: Secondary | ICD-10-CM | POA: Diagnosis not present

## 2021-11-12 DIAGNOSIS — E7800 Pure hypercholesterolemia, unspecified: Secondary | ICD-10-CM | POA: Diagnosis not present

## 2021-11-12 DIAGNOSIS — Z0000 Encounter for general adult medical examination without abnormal findings: Secondary | ICD-10-CM | POA: Diagnosis not present

## 2021-11-12 DIAGNOSIS — I10 Essential (primary) hypertension: Secondary | ICD-10-CM | POA: Diagnosis not present

## 2021-11-12 DIAGNOSIS — Z1331 Encounter for screening for depression: Secondary | ICD-10-CM | POA: Diagnosis not present

## 2021-11-12 DIAGNOSIS — Z7189 Other specified counseling: Secondary | ICD-10-CM | POA: Diagnosis not present

## 2021-11-12 DIAGNOSIS — Z6820 Body mass index (BMI) 20.0-20.9, adult: Secondary | ICD-10-CM | POA: Diagnosis not present

## 2021-11-28 DIAGNOSIS — H01002 Unspecified blepharitis right lower eyelid: Secondary | ICD-10-CM | POA: Diagnosis not present

## 2021-11-28 DIAGNOSIS — H01005 Unspecified blepharitis left lower eyelid: Secondary | ICD-10-CM | POA: Diagnosis not present

## 2021-11-28 DIAGNOSIS — H01004 Unspecified blepharitis left upper eyelid: Secondary | ICD-10-CM | POA: Diagnosis not present

## 2021-11-28 DIAGNOSIS — H01001 Unspecified blepharitis right upper eyelid: Secondary | ICD-10-CM | POA: Diagnosis not present

## 2021-11-28 DIAGNOSIS — H353131 Nonexudative age-related macular degeneration, bilateral, early dry stage: Secondary | ICD-10-CM | POA: Diagnosis not present

## 2021-12-25 DIAGNOSIS — Z299 Encounter for prophylactic measures, unspecified: Secondary | ICD-10-CM | POA: Diagnosis not present

## 2021-12-25 DIAGNOSIS — H00016 Hordeolum externum left eye, unspecified eyelid: Secondary | ICD-10-CM | POA: Diagnosis not present

## 2021-12-25 DIAGNOSIS — H43392 Other vitreous opacities, left eye: Secondary | ICD-10-CM | POA: Diagnosis not present

## 2021-12-25 DIAGNOSIS — I10 Essential (primary) hypertension: Secondary | ICD-10-CM | POA: Diagnosis not present

## 2021-12-25 DIAGNOSIS — R5383 Other fatigue: Secondary | ICD-10-CM | POA: Diagnosis not present

## 2021-12-25 DIAGNOSIS — Z79899 Other long term (current) drug therapy: Secondary | ICD-10-CM | POA: Diagnosis not present

## 2021-12-25 DIAGNOSIS — Z125 Encounter for screening for malignant neoplasm of prostate: Secondary | ICD-10-CM | POA: Diagnosis not present

## 2021-12-25 DIAGNOSIS — Z681 Body mass index (BMI) 19 or less, adult: Secondary | ICD-10-CM | POA: Diagnosis not present

## 2021-12-25 DIAGNOSIS — E7800 Pure hypercholesterolemia, unspecified: Secondary | ICD-10-CM | POA: Diagnosis not present

## 2021-12-25 DIAGNOSIS — F17210 Nicotine dependence, cigarettes, uncomplicated: Secondary | ICD-10-CM | POA: Diagnosis not present

## 2021-12-25 DIAGNOSIS — Z89511 Acquired absence of right leg below knee: Secondary | ICD-10-CM | POA: Diagnosis not present

## 2022-01-02 DIAGNOSIS — I10 Essential (primary) hypertension: Secondary | ICD-10-CM | POA: Diagnosis not present

## 2022-01-02 DIAGNOSIS — Z79899 Other long term (current) drug therapy: Secondary | ICD-10-CM | POA: Diagnosis not present

## 2022-01-02 DIAGNOSIS — I6930 Unspecified sequelae of cerebral infarction: Secondary | ICD-10-CM | POA: Diagnosis not present

## 2022-01-02 DIAGNOSIS — G546 Phantom limb syndrome with pain: Secondary | ICD-10-CM | POA: Diagnosis not present

## 2022-01-02 DIAGNOSIS — M79606 Pain in leg, unspecified: Secondary | ICD-10-CM | POA: Diagnosis not present

## 2022-01-02 DIAGNOSIS — F419 Anxiety disorder, unspecified: Secondary | ICD-10-CM | POA: Diagnosis not present

## 2022-01-10 DIAGNOSIS — Z713 Dietary counseling and surveillance: Secondary | ICD-10-CM | POA: Diagnosis not present

## 2022-01-10 DIAGNOSIS — F17210 Nicotine dependence, cigarettes, uncomplicated: Secondary | ICD-10-CM | POA: Diagnosis not present

## 2022-01-10 DIAGNOSIS — Z299 Encounter for prophylactic measures, unspecified: Secondary | ICD-10-CM | POA: Diagnosis not present

## 2022-01-10 DIAGNOSIS — I10 Essential (primary) hypertension: Secondary | ICD-10-CM | POA: Diagnosis not present

## 2022-01-10 DIAGNOSIS — Z6820 Body mass index (BMI) 20.0-20.9, adult: Secondary | ICD-10-CM | POA: Diagnosis not present

## 2022-01-16 DIAGNOSIS — H01002 Unspecified blepharitis right lower eyelid: Secondary | ICD-10-CM | POA: Diagnosis not present

## 2022-01-16 DIAGNOSIS — H47293 Other optic atrophy, bilateral: Secondary | ICD-10-CM | POA: Diagnosis not present

## 2022-01-16 DIAGNOSIS — H01001 Unspecified blepharitis right upper eyelid: Secondary | ICD-10-CM | POA: Diagnosis not present

## 2022-01-16 DIAGNOSIS — H01004 Unspecified blepharitis left upper eyelid: Secondary | ICD-10-CM | POA: Diagnosis not present

## 2022-01-16 DIAGNOSIS — H01005 Unspecified blepharitis left lower eyelid: Secondary | ICD-10-CM | POA: Diagnosis not present

## 2022-01-31 DIAGNOSIS — Z299 Encounter for prophylactic measures, unspecified: Secondary | ICD-10-CM | POA: Diagnosis not present

## 2022-01-31 DIAGNOSIS — I10 Essential (primary) hypertension: Secondary | ICD-10-CM | POA: Diagnosis not present

## 2022-01-31 DIAGNOSIS — Z6820 Body mass index (BMI) 20.0-20.9, adult: Secondary | ICD-10-CM | POA: Diagnosis not present

## 2022-01-31 DIAGNOSIS — F17210 Nicotine dependence, cigarettes, uncomplicated: Secondary | ICD-10-CM | POA: Diagnosis not present

## 2022-01-31 DIAGNOSIS — R059 Cough, unspecified: Secondary | ICD-10-CM | POA: Diagnosis not present

## 2022-02-03 DIAGNOSIS — I10 Essential (primary) hypertension: Secondary | ICD-10-CM | POA: Diagnosis not present

## 2022-03-05 DIAGNOSIS — I10 Essential (primary) hypertension: Secondary | ICD-10-CM | POA: Diagnosis not present

## 2022-04-21 ENCOUNTER — Other Ambulatory Visit: Payer: Self-pay

## 2022-04-21 ENCOUNTER — Emergency Department (HOSPITAL_COMMUNITY): Payer: Medicare HMO | Admitting: Anesthesiology

## 2022-04-21 ENCOUNTER — Emergency Department (HOSPITAL_COMMUNITY): Payer: Medicare HMO

## 2022-04-21 ENCOUNTER — Inpatient Hospital Stay (HOSPITAL_COMMUNITY)
Admission: EM | Admit: 2022-04-21 | Discharge: 2022-05-07 | DRG: 023 | Disposition: E | Payer: Medicare HMO | Attending: Neurology | Admitting: Neurology

## 2022-04-21 ENCOUNTER — Encounter (HOSPITAL_COMMUNITY): Admission: EM | Disposition: E | Payer: Self-pay | Source: Home / Self Care | Attending: Neurology

## 2022-04-21 DIAGNOSIS — G546 Phantom limb syndrome with pain: Secondary | ICD-10-CM | POA: Diagnosis not present

## 2022-04-21 DIAGNOSIS — E785 Hyperlipidemia, unspecified: Secondary | ICD-10-CM | POA: Diagnosis present

## 2022-04-21 DIAGNOSIS — R1310 Dysphagia, unspecified: Secondary | ICD-10-CM | POA: Diagnosis present

## 2022-04-21 DIAGNOSIS — Z9911 Dependence on respirator [ventilator] status: Secondary | ICD-10-CM | POA: Diagnosis not present

## 2022-04-21 DIAGNOSIS — I63412 Cerebral infarction due to embolism of left middle cerebral artery: Principal | ICD-10-CM | POA: Diagnosis present

## 2022-04-21 DIAGNOSIS — J449 Chronic obstructive pulmonary disease, unspecified: Secondary | ICD-10-CM | POA: Diagnosis not present

## 2022-04-21 DIAGNOSIS — I129 Hypertensive chronic kidney disease with stage 1 through stage 4 chronic kidney disease, or unspecified chronic kidney disease: Secondary | ICD-10-CM | POA: Diagnosis present

## 2022-04-21 DIAGNOSIS — D631 Anemia in chronic kidney disease: Secondary | ICD-10-CM | POA: Diagnosis present

## 2022-04-21 DIAGNOSIS — R4701 Aphasia: Secondary | ICD-10-CM | POA: Diagnosis present

## 2022-04-21 DIAGNOSIS — N1330 Unspecified hydronephrosis: Secondary | ICD-10-CM | POA: Diagnosis not present

## 2022-04-21 DIAGNOSIS — I252 Old myocardial infarction: Secondary | ICD-10-CM | POA: Diagnosis not present

## 2022-04-21 DIAGNOSIS — I63232 Cerebral infarction due to unspecified occlusion or stenosis of left carotid arteries: Secondary | ICD-10-CM | POA: Diagnosis not present

## 2022-04-21 DIAGNOSIS — G936 Cerebral edema: Secondary | ICD-10-CM | POA: Diagnosis present

## 2022-04-21 DIAGNOSIS — B1920 Unspecified viral hepatitis C without hepatic coma: Secondary | ICD-10-CM | POA: Diagnosis present

## 2022-04-21 DIAGNOSIS — H518 Other specified disorders of binocular movement: Secondary | ICD-10-CM | POA: Diagnosis present

## 2022-04-21 DIAGNOSIS — R58 Hemorrhage, not elsewhere classified: Secondary | ICD-10-CM | POA: Diagnosis not present

## 2022-04-21 DIAGNOSIS — I63312 Cerebral infarction due to thrombosis of left middle cerebral artery: Secondary | ICD-10-CM | POA: Diagnosis not present

## 2022-04-21 DIAGNOSIS — I480 Paroxysmal atrial fibrillation: Secondary | ICD-10-CM | POA: Diagnosis present

## 2022-04-21 DIAGNOSIS — J9601 Acute respiratory failure with hypoxia: Secondary | ICD-10-CM | POA: Diagnosis not present

## 2022-04-21 DIAGNOSIS — I639 Cerebral infarction, unspecified: Secondary | ICD-10-CM | POA: Diagnosis present

## 2022-04-21 DIAGNOSIS — D649 Anemia, unspecified: Secondary | ICD-10-CM | POA: Diagnosis not present

## 2022-04-21 DIAGNOSIS — E162 Hypoglycemia, unspecified: Secondary | ICD-10-CM | POA: Diagnosis present

## 2022-04-21 DIAGNOSIS — F1021 Alcohol dependence, in remission: Secondary | ICD-10-CM | POA: Diagnosis present

## 2022-04-21 DIAGNOSIS — N170 Acute kidney failure with tubular necrosis: Secondary | ICD-10-CM | POA: Diagnosis not present

## 2022-04-21 DIAGNOSIS — Z515 Encounter for palliative care: Secondary | ICD-10-CM | POA: Diagnosis not present

## 2022-04-21 DIAGNOSIS — R29723 NIHSS score 23: Secondary | ICD-10-CM | POA: Diagnosis present

## 2022-04-21 DIAGNOSIS — I499 Cardiac arrhythmia, unspecified: Secondary | ICD-10-CM | POA: Diagnosis not present

## 2022-04-21 DIAGNOSIS — G919 Hydrocephalus, unspecified: Secondary | ICD-10-CM | POA: Diagnosis present

## 2022-04-21 DIAGNOSIS — E440 Moderate protein-calorie malnutrition: Secondary | ICD-10-CM | POA: Insufficient documentation

## 2022-04-21 DIAGNOSIS — I6522 Occlusion and stenosis of left carotid artery: Secondary | ICD-10-CM | POA: Diagnosis present

## 2022-04-21 DIAGNOSIS — Z89511 Acquired absence of right leg below knee: Secondary | ICD-10-CM

## 2022-04-21 DIAGNOSIS — Z66 Do not resuscitate: Secondary | ICD-10-CM | POA: Diagnosis present

## 2022-04-21 DIAGNOSIS — I6602 Occlusion and stenosis of left middle cerebral artery: Secondary | ICD-10-CM | POA: Diagnosis present

## 2022-04-21 DIAGNOSIS — Z4682 Encounter for fitting and adjustment of non-vascular catheter: Secondary | ICD-10-CM | POA: Diagnosis not present

## 2022-04-21 DIAGNOSIS — G8191 Hemiplegia, unspecified affecting right dominant side: Secondary | ICD-10-CM | POA: Diagnosis present

## 2022-04-21 DIAGNOSIS — G935 Compression of brain: Secondary | ICD-10-CM | POA: Diagnosis present

## 2022-04-21 DIAGNOSIS — N184 Chronic kidney disease, stage 4 (severe): Secondary | ICD-10-CM | POA: Diagnosis not present

## 2022-04-21 DIAGNOSIS — F17210 Nicotine dependence, cigarettes, uncomplicated: Secondary | ICD-10-CM | POA: Diagnosis not present

## 2022-04-21 DIAGNOSIS — I2510 Atherosclerotic heart disease of native coronary artery without angina pectoris: Secondary | ICD-10-CM | POA: Diagnosis present

## 2022-04-21 DIAGNOSIS — I10 Essential (primary) hypertension: Secondary | ICD-10-CM | POA: Diagnosis not present

## 2022-04-21 DIAGNOSIS — R531 Weakness: Secondary | ICD-10-CM | POA: Diagnosis not present

## 2022-04-21 DIAGNOSIS — Z681 Body mass index (BMI) 19 or less, adult: Secondary | ICD-10-CM

## 2022-04-21 DIAGNOSIS — Z85828 Personal history of other malignant neoplasm of skin: Secondary | ICD-10-CM

## 2022-04-21 DIAGNOSIS — F419 Anxiety disorder, unspecified: Secondary | ICD-10-CM | POA: Diagnosis not present

## 2022-04-21 DIAGNOSIS — I63512 Cerebral infarction due to unspecified occlusion or stenosis of left middle cerebral artery: Secondary | ICD-10-CM | POA: Diagnosis not present

## 2022-04-21 DIAGNOSIS — I63522 Cerebral infarction due to unspecified occlusion or stenosis of left anterior cerebral artery: Secondary | ICD-10-CM | POA: Diagnosis not present

## 2022-04-21 DIAGNOSIS — R29810 Facial weakness: Secondary | ICD-10-CM | POA: Diagnosis present

## 2022-04-21 DIAGNOSIS — G911 Obstructive hydrocephalus: Secondary | ICD-10-CM | POA: Diagnosis not present

## 2022-04-21 DIAGNOSIS — J45909 Unspecified asthma, uncomplicated: Secondary | ICD-10-CM | POA: Diagnosis not present

## 2022-04-21 DIAGNOSIS — N179 Acute kidney failure, unspecified: Secondary | ICD-10-CM | POA: Diagnosis not present

## 2022-04-21 DIAGNOSIS — E781 Pure hyperglyceridemia: Secondary | ICD-10-CM | POA: Diagnosis present

## 2022-04-21 DIAGNOSIS — E872 Acidosis, unspecified: Secondary | ICD-10-CM | POA: Diagnosis not present

## 2022-04-21 DIAGNOSIS — I6381 Other cerebral infarction due to occlusion or stenosis of small artery: Secondary | ICD-10-CM | POA: Diagnosis not present

## 2022-04-21 DIAGNOSIS — Z87891 Personal history of nicotine dependence: Secondary | ICD-10-CM

## 2022-04-21 DIAGNOSIS — W19XXXA Unspecified fall, initial encounter: Secondary | ICD-10-CM | POA: Diagnosis not present

## 2022-04-21 DIAGNOSIS — F17200 Nicotine dependence, unspecified, uncomplicated: Secondary | ICD-10-CM | POA: Diagnosis not present

## 2022-04-21 DIAGNOSIS — F1320 Sedative, hypnotic or anxiolytic dependence, uncomplicated: Secondary | ICD-10-CM | POA: Diagnosis present

## 2022-04-21 DIAGNOSIS — R451 Restlessness and agitation: Secondary | ICD-10-CM | POA: Diagnosis present

## 2022-04-21 DIAGNOSIS — M79606 Pain in leg, unspecified: Secondary | ICD-10-CM | POA: Diagnosis not present

## 2022-04-21 DIAGNOSIS — I6930 Unspecified sequelae of cerebral infarction: Secondary | ICD-10-CM | POA: Diagnosis not present

## 2022-04-21 DIAGNOSIS — Z79899 Other long term (current) drug therapy: Secondary | ICD-10-CM

## 2022-04-21 DIAGNOSIS — R402A Nontraumatic coma due to underlying condition: Secondary | ICD-10-CM | POA: Diagnosis not present

## 2022-04-21 DIAGNOSIS — G4700 Insomnia, unspecified: Secondary | ICD-10-CM | POA: Diagnosis not present

## 2022-04-21 DIAGNOSIS — I63422 Cerebral infarction due to embolism of left anterior cerebral artery: Secondary | ICD-10-CM | POA: Diagnosis present

## 2022-04-21 HISTORY — PX: RADIOLOGY WITH ANESTHESIA: SHX6223

## 2022-04-21 LAB — CBC
HCT: 40.2 % (ref 39.0–52.0)
Hemoglobin: 13.4 g/dL (ref 13.0–17.0)
MCH: 29.4 pg (ref 26.0–34.0)
MCHC: 33.3 g/dL (ref 30.0–36.0)
MCV: 88.2 fL (ref 80.0–100.0)
Platelets: 299 10*3/uL (ref 150–400)
RBC: 4.56 MIL/uL (ref 4.22–5.81)
RDW: 13.9 % (ref 11.5–15.5)
WBC: 10.1 10*3/uL (ref 4.0–10.5)
nRBC: 0 % (ref 0.0–0.2)

## 2022-04-21 LAB — I-STAT CHEM 8, ED
BUN: 33 mg/dL — ABNORMAL HIGH (ref 8–23)
Calcium, Ion: 1.04 mmol/L — ABNORMAL LOW (ref 1.15–1.40)
Chloride: 104 mmol/L (ref 98–111)
Creatinine, Ser: 2.6 mg/dL — ABNORMAL HIGH (ref 0.61–1.24)
Glucose, Bld: 98 mg/dL (ref 70–99)
HCT: 41 % (ref 39.0–52.0)
Hemoglobin: 13.9 g/dL (ref 13.0–17.0)
Potassium: 4.1 mmol/L (ref 3.5–5.1)
Sodium: 138 mmol/L (ref 135–145)
TCO2: 23 mmol/L (ref 22–32)

## 2022-04-21 LAB — CBG MONITORING, ED: Glucose-Capillary: 109 mg/dL — ABNORMAL HIGH (ref 70–99)

## 2022-04-21 LAB — COMPREHENSIVE METABOLIC PANEL
ALT: 15 U/L (ref 0–44)
AST: 25 U/L (ref 15–41)
Albumin: 4 g/dL (ref 3.5–5.0)
Alkaline Phosphatase: 68 U/L (ref 38–126)
Anion gap: 12 (ref 5–15)
BUN: 30 mg/dL — ABNORMAL HIGH (ref 8–23)
CO2: 20 mmol/L — ABNORMAL LOW (ref 22–32)
Calcium: 9 mg/dL (ref 8.9–10.3)
Chloride: 105 mmol/L (ref 98–111)
Creatinine, Ser: 2.47 mg/dL — ABNORMAL HIGH (ref 0.61–1.24)
GFR, Estimated: 27 mL/min — ABNORMAL LOW (ref >60)
Glucose, Bld: 104 mg/dL — ABNORMAL HIGH (ref 70–99)
Potassium: 4.1 mmol/L (ref 3.5–5.1)
Sodium: 137 mmol/L (ref 135–145)
Total Bilirubin: 0.8 mg/dL (ref 0.3–1.2)
Total Protein: 7.2 g/dL (ref 6.5–8.1)

## 2022-04-21 LAB — DIFFERENTIAL
Abs Immature Granulocytes: 0.03 10*3/uL (ref 0.00–0.07)
Basophils Absolute: 0.1 10*3/uL (ref 0.0–0.1)
Basophils Relative: 1 %
Eosinophils Absolute: 0.3 10*3/uL (ref 0.0–0.5)
Eosinophils Relative: 3 %
Immature Granulocytes: 0 %
Lymphocytes Relative: 32 %
Lymphs Abs: 3.2 10*3/uL (ref 0.7–4.0)
Monocytes Absolute: 0.5 10*3/uL (ref 0.1–1.0)
Monocytes Relative: 5 %
Neutro Abs: 5.9 10*3/uL (ref 1.7–7.7)
Neutrophils Relative %: 59 %

## 2022-04-21 LAB — PROTIME-INR
INR: 1 (ref 0.8–1.2)
Prothrombin Time: 13.6 seconds (ref 11.4–15.2)

## 2022-04-21 LAB — ETHANOL: Alcohol, Ethyl (B): <10 mg/dL (ref <10)

## 2022-04-21 LAB — APTT: aPTT: 33 seconds (ref 24–36)

## 2022-04-21 SURGERY — IR WITH ANESTHESIA
Anesthesia: General

## 2022-04-21 MED ORDER — LORAZEPAM 2 MG/ML IJ SOLN
INTRAMUSCULAR | Status: AC
  Administered 2022-04-21: 2 mg via INTRAVENOUS
  Filled 2022-04-21: qty 1

## 2022-04-21 MED ORDER — ACETAMINOPHEN 650 MG RE SUPP: 650.0000 mg | RECTAL | Status: DC | PRN

## 2022-04-21 MED ORDER — STROKE: EARLY STAGES OF RECOVERY BOOK
Freq: Once | Status: AC
  Filled 2022-04-21: qty 1

## 2022-04-21 MED ORDER — LABETALOL HCL 5 MG/ML IV SOLN: 10.0000 mg | Freq: Once | INTRAVENOUS | Status: AC

## 2022-04-21 MED ORDER — SODIUM CHLORIDE (PF) 0.9 % IJ SOLN
INTRAVENOUS | Status: AC | PRN
  Administered 2022-04-21: 25 ug via INTRA_ARTERIAL

## 2022-04-21 MED ORDER — LABETALOL HCL 5 MG/ML IV SOLN
10.0000 mg | Freq: Once | INTRAVENOUS | Status: AC
  Administered 2022-04-21: 10 mg via INTRAVENOUS

## 2022-04-21 MED ORDER — LORAZEPAM 2 MG/ML IJ SOLN: 2.0000 mg | Freq: Once | INTRAMUSCULAR | Status: AC

## 2022-04-21 MED ORDER — PROPOFOL 10 MG/ML IV BOLUS
INTRAVENOUS | Status: DC | PRN
  Administered 2022-04-21: 80 mg via INTRAVENOUS

## 2022-04-21 MED ORDER — ACETAMINOPHEN 160 MG/5ML PO SOLN: 650.0000 mg | ORAL | Status: DC | PRN

## 2022-04-21 MED ORDER — CLEVIDIPINE BUTYRATE 0.5 MG/ML IV EMUL
0.0000 mg/h | INTRAVENOUS | Status: DC
  Administered 2022-04-21: 25 mg/h via INTRAVENOUS
  Administered 2022-04-22: 20 mg/h via INTRAVENOUS
  Filled 2022-04-21: qty 50

## 2022-04-21 MED ORDER — TENECTEPLASE FOR STROKE
0.2500 mg/kg | PACK | Freq: Once | INTRAVENOUS | Status: AC
  Administered 2022-04-21: 15 mg via INTRAVENOUS
  Filled 2022-04-21: qty 10

## 2022-04-21 MED ORDER — SODIUM CHLORIDE 0.9 % IV SOLN: INTRAVENOUS | Status: DC | PRN

## 2022-04-21 MED ORDER — ATENOLOL 50 MG PO TABS: 50.0000 mg | ORAL_TABLET | Freq: Every day | ORAL | Status: DC

## 2022-04-21 MED ORDER — SENNOSIDES-DOCUSATE SODIUM 8.6-50 MG PO TABS: 1.0000 | ORAL_TABLET | Freq: Every evening | ORAL | Status: DC | PRN

## 2022-04-21 MED ORDER — ALPRAZOLAM 0.5 MG PO TABS: 2.0000 mg | ORAL_TABLET | Freq: Every evening | ORAL | Status: DC | PRN

## 2022-04-21 MED ORDER — SODIUM CHLORIDE 0.9 % IV SOLN: INTRAVENOUS | Status: DC

## 2022-04-21 MED ORDER — SODIUM CHLORIDE 0.9% FLUSH
3.0000 mL | Freq: Once | INTRAVENOUS | Status: AC
  Administered 2022-04-22: 3 mL via INTRAVENOUS

## 2022-04-21 MED ORDER — HALOPERIDOL LACTATE 5 MG/ML IJ SOLN: 1.0000 mg | Freq: Once | INTRAMUSCULAR | Status: AC

## 2022-04-21 MED ORDER — FENTANYL CITRATE PF 50 MCG/ML IJ SOSY: 12.5000 ug | PREFILLED_SYRINGE | Freq: Once | INTRAMUSCULAR | Status: DC

## 2022-04-21 MED ORDER — LIDOCAINE HCL (CARDIAC) PF 100 MG/5ML IV SOSY
PREFILLED_SYRINGE | INTRAVENOUS | Status: DC | PRN
  Administered 2022-04-21: 80 mg via INTRAVENOUS

## 2022-04-21 MED ORDER — ACETAMINOPHEN 325 MG PO TABS: 650.0000 mg | ORAL_TABLET | ORAL | Status: DC | PRN

## 2022-04-21 MED ORDER — FENTANYL CITRATE (PF) 100 MCG/2ML IJ SOLN
INTRAMUSCULAR | Status: AC
  Filled 2022-04-21: qty 2

## 2022-04-21 MED ORDER — CEFAZOLIN SODIUM-DEXTROSE 2-3 GM-%(50ML) IV SOLR
INTRAVENOUS | Status: DC | PRN
  Administered 2022-04-21: 2 g via INTRAVENOUS

## 2022-04-21 MED ORDER — LIDOCAINE HCL 1 % IJ SOLN
INTRAMUSCULAR | Status: AC
  Filled 2022-04-21: qty 20

## 2022-04-21 MED ORDER — PROPOFOL 500 MG/50ML IV EMUL
INTRAVENOUS | Status: DC | PRN
  Administered 2022-04-21: 50 ug/kg/min via INTRAVENOUS

## 2022-04-21 MED ORDER — SUCCINYLCHOLINE CHLORIDE 200 MG/10ML IV SOSY
PREFILLED_SYRINGE | INTRAVENOUS | Status: DC | PRN
  Administered 2022-04-21: 100 mg via INTRAVENOUS

## 2022-04-21 MED ORDER — LABETALOL HCL 5 MG/ML IV SOLN
INTRAVENOUS | Status: AC
  Filled 2022-04-21: qty 4

## 2022-04-21 MED ORDER — CLEVIDIPINE BUTYRATE 0.5 MG/ML IV EMUL
INTRAVENOUS | Status: AC
  Filled 2022-04-21: qty 50

## 2022-04-21 MED ORDER — NITROGLYCERIN 1 MG/10 ML FOR IR/CATH LAB
INTRA_ARTERIAL | Status: AC
  Filled 2022-04-21: qty 10

## 2022-04-21 MED ORDER — CLEVIDIPINE BUTYRATE 0.5 MG/ML IV EMUL
INTRAVENOUS | Status: AC
  Administered 2022-04-21: 1 mg/h via INTRAVENOUS
  Filled 2022-04-21: qty 50

## 2022-04-21 MED ORDER — ALBUTEROL SULFATE HFA 108 (90 BASE) MCG/ACT IN AERS
INHALATION_SPRAY | RESPIRATORY_TRACT | Status: DC | PRN
  Administered 2022-04-21: 3 via RESPIRATORY_TRACT

## 2022-04-21 MED ORDER — PANTOPRAZOLE SODIUM 40 MG IV SOLR: 40.0000 mg | Freq: Every day | INTRAVENOUS | Status: DC

## 2022-04-21 MED ORDER — CEFAZOLIN SODIUM-DEXTROSE 2-4 GM/100ML-% IV SOLN
INTRAVENOUS | Status: AC
  Filled 2022-04-21: qty 100

## 2022-04-21 MED ORDER — HALOPERIDOL LACTATE 5 MG/ML IJ SOLN
INTRAMUSCULAR | Status: AC
  Administered 2022-04-21: 1 mg via INTRAVENOUS
  Filled 2022-04-21: qty 1

## 2022-04-21 MED ORDER — LABETALOL HCL 5 MG/ML IV SOLN
INTRAVENOUS | Status: AC
  Administered 2022-04-21: 10 mg via INTRAVENOUS
  Filled 2022-04-21: qty 4

## 2022-04-21 MED ORDER — ROCURONIUM BROMIDE 10 MG/ML (PF) SYRINGE
PREFILLED_SYRINGE | INTRAVENOUS | Status: DC | PRN
  Administered 2022-04-21: 50 mg via INTRAVENOUS
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
## 2022-04-21 NOTE — ED Notes (Addendum)
Patient still unable to keep still for CTA, unable to follow commands.   See rapid response nurse notes for more specific details.

## 2022-04-21 NOTE — Anesthesia Procedure Notes (Signed)
Arterial Line Insertion Start/End10/13/2023 10:41 PM, 05/03/2022 10:46 PM Performed by: Santa Lighter, MD, anesthesiologist  Patient location: OOR procedure area. Preanesthetic checklist: patient identified, IV checked, site marked, risks and benefits discussed, surgical consent, monitors and equipment checked, pre-op evaluation, timeout performed and anesthesia consent Lidocaine 1% used for infiltration Left, radial was placed Catheter size: 20 G Hand hygiene performed  and maximum sterile barriers used   Attempts: 1 Procedure performed without using ultrasound guided technique. Following insertion, dressing applied and Biopatch. Post procedure assessment: normal and unchanged  Patient tolerated the procedure well with no immediate complications.

## 2022-04-21 NOTE — Progress Notes (Addendum)
PHARMACIST CODE STROKE RESPONSE  Notified to mix TNK at 21:45 by Dr. Cheral Marker, once BP within range.  TNK preparation completed at 22:14, BP within range at 22:12. Prep completed in approximately 2 minutes.   TNK dose = 15 mg IV over 5 seconds  Issues/delays encountered (if applicable): HTN, patient required '40mg'$  of Labetalol bolus total and Cleviprex gtt up to '32mg'$ /hr in order for TNK to be administered, patient also with refractory agitation   Ventura Sellers 04/10/2022 10:16 PM
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
## 2022-04-21 NOTE — ED Notes (Signed)
TNK administered after blood pressure maintained WNL of parameter orders

## 2022-04-21 NOTE — Anesthesia Procedure Notes (Signed)
Procedure Name: Intubation Date/Time: 05/03/2022 10:40 PM  Performed by: Valetta Fuller, CRNAPre-anesthesia Checklist: Patient identified, Emergency Drugs available, Suction available and Patient being monitored Patient Re-evaluated:Patient Re-evaluated prior to induction Oxygen Delivery Method: Circle system utilized Preoxygenation: Pre-oxygenation with 100% oxygen Induction Type: IV induction, Rapid sequence and Cricoid Pressure applied Laryngoscope Size: Miller and 2 Grade View: Grade II Tube type: Oral Tube size: 7.5 mm Number of attempts: 1 Airway Equipment and Method: Stylet Placement Confirmation: ETT inserted through vocal cords under direct vision, positive ETCO2 and breath sounds checked- equal and bilateral Secured at: 23 cm Tube secured with: Tape Dental Injury: Teeth and Oropharynx as per pre-operative assessment

## 2022-04-21 NOTE — ED Triage Notes (Signed)
BIB EMS for CODE STROKE. LKW 2000, per medic pt had fallen 3x throughout the day walking and talking, was able to eat dinner. After 2000 patient became nonverbal, right sided flaccid, and unable to follow commands.

## 2022-04-21 NOTE — ED Provider Notes (Signed)
[facility] EMERGENCY DEPARTMENT Provider Note   CSN: [id number] Arrival date & time: 05/06/2022  2112     History No chief complaint on file.   Jermaine Gonzalez is a 71 y.o. male with a past medical history of hep C, alcoholism in remission, benzodiazepine dependence and tobacco use disorder presenting today as a code stroke.  Per EMS patient is coming from his house after falling and hitting his right head on the table.  Wife told EMS that he did not have a loss of consciousness.  HPI     Home Medications Prior to Admission medications   Medication Sig Start Date End Date Taking? Authorizing Provider  alprazolam Duanne Moron) 2 MG tablet Take 2 mg by mouth at bedtime. 09/17/20   [provider]  amLODipine (NORVASC) 10 MG tablet Take 10 mg by mouth daily.    [provider]  moxifloxacin (VIGAMOX) 0.5 % ophthalmic solution Place 1 drop into the right eye 3 (three) times daily.    [provider]  nepafenac (ILEVRO) 0.3 % ophthalmic suspension Place 1 drop into the right eye daily.    [provider]  prednisoLONE acetate (PRED FORTE) 1 % ophthalmic suspension Place 1 drop into the right eye in the morning, at noon, and at bedtime.    [provider]      Allergies    Patient has no known allergies.    Review of Systems   Review of Systems  Physical Exam Updated Vital Signs There were no vitals taken for this visit. Physical Exam Vitals and nursing note reviewed.  Constitutional:      Appearance: Normal appearance.  HENT:     Head: Normocephalic and atraumatic.  Eyes:     General: No scleral icterus.    Conjunctiva/sclera: Conjunctivae normal.  Pulmonary:     Effort: Pulmonary effort is normal. No respiratory distress.  Skin:    Findings: No rash.  Neurological:     Mental Status: He is alert.  Psychiatric:        Mood and Affect: Mood normal.     ED Results / Procedures / Treatments   Labs (all labs  ordered are listed, but only abnormal results are displayed) Labs Reviewed  I-STAT CHEM 8, ED - Abnormal; Notable for the following components:      Result Value   BUN 33 (*)    Creatinine, Ser 2.60 (*)    Calcium, Ion 1.04 (*)    All other components within normal limits  CBG MONITORING, ED - Abnormal; Notable for the following components:   Glucose-Capillary 109 (*)    All other components within normal limits  PROTIME-INR  APTT  CBC  DIFFERENTIAL  COMPREHENSIVE METABOLIC PANEL  ETHANOL    EKG None  Radiology CT HEAD CODE STROKE WO CONTRAST  Result Date: 05/06/2022 CLINICAL DATA:  Code stroke. EXAM: CT HEAD WITHOUT CONTRAST TECHNIQUE: Contiguous axial images were obtained from the base of the skull through the vertex without intravenous contrast. RADIATION DOSE REDUCTION: This exam was performed according to the departmental dose-optimization program which includes automated exposure control, adjustment of the mA and/or kV according to patient size and/or use of iterative reconstruction technique. COMPARISON:  Prior MRI from 02/26/2021. FINDINGS: Brain: Age-related cerebral atrophy with chronic small vessel ischemic disease. Remote lacunar infarcts present at the left basal ganglia. No acute intracranial hemorrhage. Subtle loss of gray-white matter differentiation seen involving the left insular cortex as well as the overlying supra ganglionic left  cerebral hemisphere, concerning for evolving acute early left MCA distribution infarct. No mass lesion, mass effect, or midline shift. No hydrocephalus or extra-axial fluid collection. Vascular: Asymmetric hyperdensity seen involving the left ICA terminus/left M1 segment, concerning for large vessel occlusion. Skull: Scalp soft tissues and calvarium within normal limits. Sinuses/Orbits: Globes and orbital soft tissues demonstrate no acute finding. Paranasal sinuses and mastoid air cells are clear. Other: None. ASPECTS [facility] Stroke Program  Early CT Score) - Ganglionic level infarction (caudate, lentiform nuclei, internal capsule, insula, M1-M3 cortex): 6 - Supraganglionic infarction (M4-M6 cortex): 1 Total score (0-10 with 10 being normal): 7 IMPRESSION: 1. Hyperdense left ICA terminus/left M1 segment, concerning for thrombus/LVO. Probable subtle early evolving left MCA distribution infarct involving the left insula and overlying supra ganglionic left cerebral hemisphere. No intracranial hemorrhage. 2. ASPECTS is 7. These results were communicated to Dr. Cheral Marker at 9:39 pm on 04/20/2022 by text page via the [employer] messaging system. Electronically Signed   By: Jeannine Boga M.D.   On: 04/14/2022 21:43    Procedures Procedures   Medications Ordered in ED Medications  sodium chloride flush (NS) 0.9 % injection 3 mL (has no administration in time range)    ED Course/ Medical Decision Making/ A&P                           Medical Decision Making Amount and/or Complexity of Data Reviewed Labs: ordered. Radiology: ordered.  Risk Prescription drug management.   71 year old male presenting today as a code stroke.  Last known well just prior to 8 PM.  Physical Exam: Taken to CT prior to full exam-patient given sedating medications which will limit further assessment   MDM/Disposition: Patient taken for thrombectomy with IR chest x-ray   I discussed this case with my attending physician Dr. Doren Custard about this patient who agrees with the assessment.  Diagnoses Final diagnoses:  Cerebrovascular accident (CVA) due to thrombosis of left middle cerebral artery [facility])     [name] A, PA-C 04/13/2022 2239    Godfrey Pick, MD 04/29/22 [phone]

## 2022-04-21 NOTE — Code Documentation (Addendum)
Responded to Code Stroke called at 2104 for R sided paralysis, L sided gaze, and aphasia, LSN-2000. Pt arrived at 2112, CBG-109, NIH-23. Pt given '2mg'$  ativan for agitation prior to CT head which showed hyperdense L ICA terminus/L M1 segment, concerning for thrombus/LVO, probable subtle early evolving L MCA distribution infarct involving the L insula and overlying supra ganglionic L cerebral hemisphere. Once CT head done, CTA ordered(at 2133) however pt remained agitated despite sedation meds and scan could not be completed.  Decision to give TNK at 2145, however, pt BP over TNK administration parameters. After '10mg'$  labetalol x3 and cleviprex gtt titrated to max dose of '32mg'$ (along with ativan/haldol for agitation), BP was within TNK appropriate parameters and given at 2214. Based on pt presentation, IR was paged out at 2206 while still attempting to control agitation in order to obtain CTA. Ultimately, decision was made to take pt to IR without obtaining CTA d/t inability to control agitation(after receiving '6mg'$  IV ativan and '1mg'$  IV haldol). Pt transported to IR suite at 2228. Pt ICU admission post IR intervention.   Meds given while in CT:  2124-Ativan '2mg'$  IV, '10mg'$  labetalol IV  2136-'10mg'$  labetalol IV  2140-Ativan '2mg'$  IV  2147-cleviprex gtt started and titrated for BP<185/110  2151-Ativan '2mg'$  IV  2155-'10mg'$  labetalol IV  2209-Cleviprex gtt at max '32mg'$   2214-Haldol '1mg'$  IV, TNK '15mg'$   IV   CTA-ordered at 2133 but unable to be completed TNK-ordered at 2145, given at 2214 IR paged out at 2206, pt to IR at 2228

## 2022-04-21 NOTE — ED Notes (Signed)
Patient unable to stay still for CTA. Neurologist at bedside providing verbal orders for blood pressure treatment and sedatives.

## 2022-04-21 NOTE — Procedures (Signed)
INR. Status post left common carotid arteriogram. Left CFA approach Findings. 1.  Occluded left internal carotid artery terminus, left anterior cerebral artery A1 segment, and left middle cerebral artery M1 segment. Status post complete revascularization of occluded left middle cerebral M1 segment, and left anterior cerebral A1 segment and of the left ICA terminus with 1 pass with a 3 mm x 40 mm Solitaire X retrieval device , and aspiration achieving aTICI 3 revascularization. Post CT of the brain demonstrates no evidence of intracranial hemorrhage. Patient left intubated to maintain airway, due to patient's preprocedural condition. Manual compression held at the left groin puncture with quick clot for approximately 35 minutes for hemostasis. Distal pulses in the left foot dopplerable unchanged. Arlean Hopping MD.

## 2022-04-21 NOTE — Procedures (Incomplete)
INR. Status post left common carotid arteriogram. Left CFA approach Findings. 1.  Occluded left internal carotid artery terminus, left anterior cerebral artery A1 segment, and left middle cerebral artery M1 segment. Status post complex revascularization of occluded left middle cerebral M1 segment, and left anterior cerebral A1 segment and of the left ICA terminus with 1 pass with a 3 mm x 40 mm solitary extragenital device, and aspiration achieving aTICI 3 revascularization. Post CT of the brain   Patient left intubated to maintain airway, due to patient's preprocedural condition. Manual compression held at the left groin puncture with quick clot for approximately 25 minutes for hemostasis. Distal left pulses

## 2022-04-22 ENCOUNTER — Inpatient Hospital Stay (HOSPITAL_COMMUNITY): Payer: Medicare HMO

## 2022-04-22 ENCOUNTER — Encounter (HOSPITAL_COMMUNITY): Payer: Self-pay | Admitting: Radiology

## 2022-04-22 DIAGNOSIS — I6602 Occlusion and stenosis of left middle cerebral artery: Secondary | ICD-10-CM | POA: Diagnosis not present

## 2022-04-22 DIAGNOSIS — I639 Cerebral infarction, unspecified: Secondary | ICD-10-CM | POA: Diagnosis not present

## 2022-04-22 DIAGNOSIS — E440 Moderate protein-calorie malnutrition: Secondary | ICD-10-CM | POA: Insufficient documentation

## 2022-04-22 DIAGNOSIS — Z9911 Dependence on respirator [ventilator] status: Secondary | ICD-10-CM

## 2022-04-22 DIAGNOSIS — I63312 Cerebral infarction due to thrombosis of left middle cerebral artery: Secondary | ICD-10-CM | POA: Diagnosis not present

## 2022-04-22 HISTORY — PX: IR PERCUTANEOUS ART THROMBECTOMY/INFUSION INTRACRANIAL INC DIAG ANGIO: IMG6087

## 2022-04-22 HISTORY — PX: IR ANGIOGRAM EXTREMITY LEFT: IMG651

## 2022-04-22 HISTORY — PX: IR CT HEAD LTD: IMG2386

## 2022-04-22 LAB — PHOSPHORUS: Phosphorus: 2.6 mg/dL (ref 2.5–4.6)

## 2022-04-22 LAB — CBC WITH DIFFERENTIAL/PLATELET
Abs Immature Granulocytes: 0.05 10*3/uL (ref 0.00–0.07)
Basophils Absolute: 0 10*3/uL (ref 0.0–0.1)
Basophils Relative: 0 %
Eosinophils Absolute: 0 10*3/uL (ref 0.0–0.5)
Eosinophils Relative: 0 %
HCT: 31.2 % — ABNORMAL LOW (ref 39.0–52.0)
Hemoglobin: 10.6 g/dL — ABNORMAL LOW (ref 13.0–17.0)
Immature Granulocytes: 1 %
Lymphocytes Relative: 11 %
Lymphs Abs: 1.2 10*3/uL (ref 0.7–4.0)
MCH: 29.9 pg (ref 26.0–34.0)
MCHC: 34 g/dL (ref 30.0–36.0)
MCV: 87.9 fL (ref 80.0–100.0)
Monocytes Absolute: 0.6 10*3/uL (ref 0.1–1.0)
Monocytes Relative: 5 %
Neutro Abs: 8.9 10*3/uL — ABNORMAL HIGH (ref 1.7–7.7)
Neutrophils Relative %: 83 %
Platelets: 235 10*3/uL (ref 150–400)
RBC: 3.55 MIL/uL — ABNORMAL LOW (ref 4.22–5.81)
RDW: 14.1 % (ref 11.5–15.5)
WBC: 10.8 10*3/uL — ABNORMAL HIGH (ref 4.0–10.5)
nRBC: 0 % (ref 0.0–0.2)

## 2022-04-22 LAB — BASIC METABOLIC PANEL
Anion gap: 10 (ref 5–15)
BUN: 34 mg/dL — ABNORMAL HIGH (ref 8–23)
CO2: 19 mmol/L — ABNORMAL LOW (ref 22–32)
Calcium: 7.8 mg/dL — ABNORMAL LOW (ref 8.9–10.3)
Chloride: 105 mmol/L (ref 98–111)
Creatinine, Ser: 2.55 mg/dL — ABNORMAL HIGH (ref 0.61–1.24)
GFR, Estimated: 26 mL/min — ABNORMAL LOW (ref >60)
Glucose, Bld: 126 mg/dL — ABNORMAL HIGH (ref 70–99)
Potassium: 3.9 mmol/L (ref 3.5–5.1)
Sodium: 134 mmol/L — ABNORMAL LOW (ref 135–145)

## 2022-04-22 LAB — POCT I-STAT 7, (LYTES, BLD GAS, ICA,H+H)
Acid-base deficit: 5 mmol/L — ABNORMAL HIGH (ref 0.0–2.0)
Acid-base deficit: 8 mmol/L — ABNORMAL HIGH (ref 0.0–2.0)
Bicarbonate: 20.3 mmol/L (ref 20.0–28.0)
Bicarbonate: 14.7 mmol/L — ABNORMAL LOW (ref 20.0–28.0)
Calcium, Ion: 1.07 mmol/L — ABNORMAL LOW (ref 1.15–1.40)
Calcium, Ion: 1.04 mmol/L — ABNORMAL LOW (ref 1.15–1.40)
HCT: 28 % — ABNORMAL LOW (ref 39.0–52.0)
HCT: 31 % — ABNORMAL LOW (ref 39.0–52.0)
Hemoglobin: 9.5 g/dL — ABNORMAL LOW (ref 13.0–17.0)
Hemoglobin: 10.5 g/dL — ABNORMAL LOW (ref 13.0–17.0)
O2 Saturation: 100 %
O2 Saturation: 98 %
Patient temperature: 35.2
Patient temperature: 98.6
Potassium: 3.6 mmol/L (ref 3.5–5.1)
Potassium: 3.5 mmol/L (ref 3.5–5.1)
Sodium: 136 mmol/L (ref 135–145)
Sodium: 136 mmol/L (ref 135–145)
TCO2: 21 mmol/L — ABNORMAL LOW (ref 22–32)
TCO2: 15 mmol/L — ABNORMAL LOW (ref 22–32)
pCO2 arterial: 34.7 mmHg (ref 32–48)
pCO2 arterial: 21.8 mmHg — ABNORMAL LOW (ref 32–48)
pH, Arterial: 7.367 (ref 7.35–7.45)
pH, Arterial: 7.436 (ref 7.35–7.45)
pO2, Arterial: 276 mmHg — ABNORMAL HIGH (ref 83–108)
pO2, Arterial: 107 mmHg (ref 83–108)

## 2022-04-22 LAB — URINALYSIS, ROUTINE W REFLEX MICROSCOPIC
Bilirubin Urine: NEGATIVE
Glucose, UA: NEGATIVE mg/dL
Ketones, ur: NEGATIVE mg/dL
Leukocytes,Ua: NEGATIVE
Nitrite: NEGATIVE
Protein, ur: >=300 mg/dL — AB
Specific Gravity, Urine: 1.041 — ABNORMAL HIGH (ref 1.005–1.030)
pH: 5 (ref 5.0–8.0)

## 2022-04-22 LAB — ECHOCARDIOGRAM COMPLETE
AR max vel: 2.44 cm2
AV Area VTI: 2.7 cm2
AV Area mean vel: 2.76 cm2
AV Mean grad: 3 mmHg
AV Peak grad: 7.4 mmHg
Ao pk vel: 1.36 m/s
Area-P 1/2: 4.6 cm2
Calc EF: 59.1 %
Height: 69 in
MV M vel: 3.55 m/s
MV Peak grad: 50.3 mmHg
P 1/2 time: 418 msec
S' Lateral: 3.3 cm
Single Plane A2C EF: 56 %
Single Plane A4C EF: 59.4 %
Weight: 2144.63 oz

## 2022-04-22 LAB — LDL CHOLESTEROL, DIRECT: Direct LDL: 106 mg/dL — ABNORMAL HIGH (ref 0–99)

## 2022-04-22 LAB — GLUCOSE, CAPILLARY
Glucose-Capillary: 144 mg/dL — ABNORMAL HIGH (ref 70–99)
Glucose-Capillary: 107 mg/dL — ABNORMAL HIGH (ref 70–99)
Glucose-Capillary: 162 mg/dL — ABNORMAL HIGH (ref 70–99)
Glucose-Capillary: 141 mg/dL — ABNORMAL HIGH (ref 70–99)
Glucose-Capillary: 172 mg/dL — ABNORMAL HIGH (ref 70–99)

## 2022-04-22 LAB — LIPID PANEL
Cholesterol: 170 mg/dL (ref 0–200)
HDL: 28 mg/dL — ABNORMAL LOW (ref >40)
LDL Cholesterol: UNDETERMINED mg/dL (ref 0–99)
Total CHOL/HDL Ratio: 6.1 RATIO
Triglycerides: 535 mg/dL — ABNORMAL HIGH (ref <150)
VLDL: UNDETERMINED mg/dL (ref 0–40)

## 2022-04-22 LAB — MAGNESIUM: Magnesium: 2 mg/dL (ref 1.7–2.4)

## 2022-04-22 LAB — HEMOGLOBIN A1C
Hgb A1c MFr Bld: 5.2 % (ref 4.8–5.6)
Mean Plasma Glucose: 102.54 mg/dL

## 2022-04-22 LAB — MRSA NEXT GEN BY PCR, NASAL: MRSA by PCR Next Gen: NOT DETECTED

## 2022-04-22 MED ORDER — FENTANYL CITRATE PF 50 MCG/ML IJ SOSY: 25.0000 ug | PREFILLED_SYRINGE | INTRAMUSCULAR | Status: DC | PRN

## 2022-04-22 MED ORDER — PANTOPRAZOLE 2 MG/ML SUSPENSION
40.0000 mg | Freq: Every day | ORAL | Status: DC
  Administered 2022-04-22 – 2022-04-23 (×2): 40 mg
  Filled 2022-04-22 (×2): qty 20

## 2022-04-22 MED ORDER — ATENOLOL 50 MG PO TABS
50.0000 mg | ORAL_TABLET | Freq: Every day | ORAL | Status: DC
  Administered 2022-04-22: 50 mg
  Filled 2022-04-22 (×2): qty 1

## 2022-04-22 MED ORDER — PROSOURCE TF20 ENFIT COMPATIBL EN LIQD
60.0000 mL | Freq: Every day | ENTERAL | Status: DC
  Administered 2022-04-22 – 2022-04-23 (×2): 60 mL
  Filled 2022-04-22 (×2): qty 60

## 2022-04-22 MED ORDER — SENNOSIDES-DOCUSATE SODIUM 8.6-50 MG PO TABS: 1.0000 | ORAL_TABLET | Freq: Every evening | ORAL | Status: DC | PRN

## 2022-04-22 MED ORDER — IPRATROPIUM-ALBUTEROL 0.5-2.5 (3) MG/3ML IN SOLN
3.0000 mL | Freq: Four times a day (QID) | RESPIRATORY_TRACT | Status: DC
  Administered 2022-04-22 – 2022-04-23 (×6): 3 mL via RESPIRATORY_TRACT
  Filled 2022-04-22 (×5): qty 3

## 2022-04-22 MED ORDER — ALBUTEROL SULFATE (2.5 MG/3ML) 0.083% IN NEBU: 2.5000 mg | INHALATION_SOLUTION | RESPIRATORY_TRACT | Status: DC | PRN

## 2022-04-22 MED ORDER — MIDAZOLAM HCL 2 MG/2ML IJ SOLN
1.0000 mg | INTRAMUSCULAR | Status: DC | PRN
  Administered 2022-04-22: 1 mg via INTRAVENOUS
  Filled 2022-04-22 (×4): qty 2

## 2022-04-22 MED ORDER — CLEVIDIPINE BUTYRATE 0.5 MG/ML IV EMUL
INTRAVENOUS | Status: AC
  Filled 2022-04-22: qty 50

## 2022-04-22 MED ORDER — ADULT MULTIVITAMIN W/MINERALS CH
1.0000 | ORAL_TABLET | Freq: Every day | ORAL | Status: DC
  Administered 2022-04-22 – 2022-04-23 (×2): 1
  Filled 2022-04-22 (×2): qty 1

## 2022-04-22 MED ORDER — CLEVIDIPINE BUTYRATE 0.5 MG/ML IV EMUL
0.0000 mg/h | INTRAVENOUS | Status: DC
  Administered 2022-04-22: 19 mg/h via INTRAVENOUS
  Administered 2022-04-22: 21 mg/h via INTRAVENOUS
  Administered 2022-04-22: 8 mg/h via INTRAVENOUS
  Administered 2022-04-22: 14 mg/h via INTRAVENOUS
  Administered 2022-04-22: 20 mg/h via INTRAVENOUS
  Administered 2022-04-22: 2 mg/h via INTRAVENOUS
  Administered 2022-04-22: 15 mg/h via INTRAVENOUS
  Administered 2022-04-22: 19 mg/h via INTRAVENOUS
  Filled 2022-04-22 (×3): qty 100
  Filled 2022-04-22: qty 200
  Filled 2022-04-22 (×3): qty 100

## 2022-04-22 MED ORDER — INSULIN ASPART 100 UNIT/ML IJ SOLN
0.0000 [IU] | INTRAMUSCULAR | Status: DC
  Administered 2022-04-22 (×2): 1 [IU] via SUBCUTANEOUS
  Administered 2022-04-22 (×2): 2 [IU] via SUBCUTANEOUS
  Administered 2022-04-23: 1 [IU] via SUBCUTANEOUS
  Administered 2022-04-23: 2 [IU] via SUBCUTANEOUS

## 2022-04-22 MED ORDER — ATENOLOL 50 MG PO TABS: 50.0000 mg | ORAL_TABLET | Freq: Every day | ORAL | Status: DC

## 2022-04-22 MED ORDER — ACETAMINOPHEN 650 MG RE SUPP: 650.0000 mg | RECTAL | Status: DC | PRN

## 2022-04-22 MED ORDER — CHLORHEXIDINE GLUCONATE CLOTH 2 % EX PADS
6.0000 | MEDICATED_PAD | Freq: Every day | CUTANEOUS | Status: DC
  Administered 2022-04-22 – 2022-04-24 (×3): 6 via TOPICAL

## 2022-04-22 MED ORDER — OSMOLITE 1.2 CAL PO LIQD
1000.0000 mL | ORAL | Status: DC
  Administered 2022-04-22 – 2022-04-24 (×2): 1000 mL

## 2022-04-22 MED ORDER — LACTATED RINGERS IV SOLN: INTRAVENOUS | Status: DC

## 2022-04-22 MED ORDER — FENTANYL CITRATE PF 50 MCG/ML IJ SOSY
25.0000 ug | PREFILLED_SYRINGE | INTRAMUSCULAR | Status: DC | PRN
  Administered 2022-04-22: 50 ug via INTRAVENOUS
  Administered 2022-04-23: 100 ug via INTRAVENOUS
  Administered 2022-04-23: 50 ug via INTRAVENOUS
  Filled 2022-04-22 (×3): qty 1
  Filled 2022-04-22: qty 2

## 2022-04-22 MED ORDER — HYDRALAZINE HCL 20 MG/ML IJ SOLN
20.0000 mg | Freq: Four times a day (QID) | INTRAMUSCULAR | Status: DC | PRN
  Administered 2022-04-22 – 2022-04-23 (×2): 20 mg via INTRAVENOUS
  Filled 2022-04-22 (×2): qty 1

## 2022-04-22 MED ORDER — SODIUM BICARBONATE 650 MG PO TABS
650.0000 mg | ORAL_TABLET | Freq: Three times a day (TID) | ORAL | Status: DC
  Administered 2022-04-22 – 2022-04-23 (×6): 650 mg
  Filled 2022-04-22 (×6): qty 1

## 2022-04-22 MED ORDER — ALPRAZOLAM 0.5 MG PO TABS: 2.0000 mg | ORAL_TABLET | Freq: Every evening | ORAL | Status: DC | PRN

## 2022-04-22 MED ORDER — IOHEXOL 300 MG/ML  SOLN
100.0000 mL | Freq: Once | INTRAMUSCULAR | Status: AC | PRN
  Administered 2022-04-22: 75 mL via INTRA_ARTERIAL

## 2022-04-22 MED ORDER — ACETAMINOPHEN 160 MG/5ML PO SOLN
650.0000 mg | ORAL | Status: DC | PRN
  Administered 2022-04-23: 650 mg
  Filled 2022-04-22: qty 20.3

## 2022-04-22 MED ORDER — SODIUM CHLORIDE 0.9 % IV SOLN: INTRAVENOUS | Status: DC

## 2022-04-22 MED ORDER — PROPOFOL 1000 MG/100ML IV EMUL
0.0000 ug/kg/min | INTRAVENOUS | Status: DC
  Administered 2022-04-22: 45 ug/kg/min via INTRAVENOUS
  Administered 2022-04-22: 50 ug/kg/min via INTRAVENOUS
  Filled 2022-04-22: qty 100

## 2022-04-22 MED ORDER — POLYETHYLENE GLYCOL 3350 17 G PO PACK
17.0000 g | PACK | Freq: Every day | ORAL | Status: DC
  Administered 2022-04-22: 17 g
  Filled 2022-04-22: qty 1

## 2022-04-22 MED ORDER — ORAL CARE MOUTH RINSE: 15.0000 mL | OROMUCOSAL | Status: DC | PRN

## 2022-04-22 MED ORDER — IPRATROPIUM-ALBUTEROL 0.5-2.5 (3) MG/3ML IN SOLN
RESPIRATORY_TRACT | Status: AC
  Filled 2022-04-22: qty 3

## 2022-04-22 MED ORDER — AMLODIPINE BESYLATE 5 MG PO TABS
5.0000 mg | ORAL_TABLET | Freq: Every day | ORAL | Status: DC
  Administered 2022-04-22 – 2022-04-23 (×2): 5 mg
  Filled 2022-04-22 (×2): qty 1

## 2022-04-22 MED ORDER — ORAL CARE MOUTH RINSE
15.0000 mL | OROMUCOSAL | Status: DC
  Administered 2022-04-22 – 2022-04-24 (×28): 15 mL via OROMUCOSAL

## 2022-04-22 MED ORDER — ACETAMINOPHEN 325 MG PO TABS: 650.0000 mg | ORAL_TABLET | ORAL | Status: DC | PRN

## 2022-04-22 MED ORDER — MIDAZOLAM HCL 2 MG/2ML IJ SOLN
1.0000 mg | INTRAMUSCULAR | Status: DC | PRN
  Administered 2022-04-22 (×2): 1 mg via INTRAVENOUS

## 2022-04-22 NOTE — Progress Notes (Signed)
  Transition of Care [facility]) Screening Note   Patient Details  Name: Jermaine Gonzalez Date of Birth: 12/18/1950   Transition of Care [facility]) CM/SW Contact:    Ella Bodo, RN Phone Number: 04/22/2022, 12:09 PM    Transition of Care Department [facility]) has reviewed patient and no TOC needs have been identified at this time. We will continue to monitor patient advancement through interdisciplinary progression rounds. If new patient transition needs arise, please place a TOC consult.  Reinaldo Raddle, RN, BSN  Trauma/Neuro ICU Case Manager [phone]
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
## 2022-04-22 NOTE — Progress Notes (Signed)
Lower extremity venous has been completed.   Preliminary results in CV Proc.   Jermaine Gonzalez 04/22/2022 3:04 PM

## 2022-04-22 NOTE — Progress Notes (Signed)
SLP Cancellation Note  Patient Details Name: Jermaine Gonzalez MRN: [id number] DOB: 1951/03/15   Cancelled treatment:       Reason Eval/Treat Not Completed: Patient not medically ready. On ventilator   [name], Katherene Ponto 04/22/2022, 8:57 AM

## 2022-04-22 NOTE — Progress Notes (Signed)
  Echocardiogram 2D Echocardiogram has been performed.  Jermaine Gonzalez 04/22/2022, 1:24 PM

## 2022-04-22 NOTE — Progress Notes (Signed)
Initial Nutrition Assessment  DOCUMENTATION CODES:   Non-severe (moderate) malnutrition in context of chronic illness  INTERVENTION:   Initiate tube feeding via OG tube: Osmolite 1.2 at 30 ml/h and increase by 10 ml every 8 hours to goal rate of 60 ml/hr (1440 ml per day) Prosource TF20 60 ml daily  Provides 1808 kcal, 100 gm protein, 1167 ml free water daily  MVI with minerals daily   NUTRITION DIAGNOSIS:   Moderate Malnutrition related to chronic illness (COPD, CKD) as evidenced by moderate fat depletion, moderate muscle depletion.  GOAL:   Patient will meet greater than or equal to 90% of their needs  MONITOR:   TF tolerance  REASON FOR ASSESSMENT:   Consult Enteral/tube feeding initiation and management  ASSESSMENT:   Pt with PMH of CKD, HTN, COPD, CAD, hep C, remote ETOH abuse, and remote R BKA from [facility] who is now admitted with L MCA CVA s/p TNK and thrombectomy.    Pt discussed during ICU rounds and with RN and MD. MD reports not extubating today and ok to start TF.   Medications reviewed and include: SSI, protonix, miralax, Nabicarb  NS @ 75 ml/hr Cleviprex @ 24 ml/hr provides: 1152 kcal   Labs reviewed: Na 134 CBG: 162  16 F OG tube: per xray tube is in fundus of stomach  NUTRITION - FOCUSED PHYSICAL EXAM:  Flowsheet Row Most Recent Value  Orbital Region Moderate depletion  Upper Arm Region Moderate depletion  Thoracic and Lumbar Region Moderate depletion  Buccal Region Unable to assess  Temple Region Mild depletion  Clavicle Bone Region Moderate depletion  Clavicle and Acromion Bone Region Moderate depletion  Scapular Bone Region Unable to assess  Dorsal Hand Unable to assess  Patellar Region Moderate depletion  Anterior Thigh Region Moderate depletion  Posterior Calf Region Moderate depletion  Edema (RD Assessment) None  Hair Unable to assess  Eyes Unable to assess  Mouth Reviewed  [bottem teeth visualized]  Skin Reviewed  Nails Unable to  assess       Diet Order:   Diet Order             Diet NPO time specified  Diet effective now                   EDUCATION NEEDS:   No education needs have been identified at this time  Skin:  Skin Assessment: Reviewed RN Assessment  Last BM:  unknown  Height:   Ht Readings from Last 1 Encounters:  04/25/2022 '5\' 9"'$  (1.753 m)    Weight:   Wt Readings from Last 1 Encounters:  04/12/2022 60.8 kg    Ideal Body Weight:     BMI:  Body mass index is 19.79 kg/m.  Estimated Nutritional Needs:   Kcal:  1700-1900  Protein:  90-100 grams  Fluid:  >1.7 L/day  Lockie Pares., RD, LDN, CNSC See AMiON for contact information

## 2022-04-22 NOTE — Progress Notes (Signed)
PT Cancellation Note  Patient Details Name: Jermaine Gonzalez MRN: [id number] DOB: 1951-07-01   Cancelled Treatment:    Reason Eval/Treat Not Completed: Active bedrest order; will attempt when activity orders upgraded.    Reginia Naas 04/22/2022, 9:06 AM Magda Kiel, PT Acute Rehabilitation Services [phone] 04/22/2022

## 2022-04-22 NOTE — Progress Notes (Signed)
This RN paged Neuro MD on call [facility] about patients 24 hour MRI and BP parameters at 24 hour mark.  Respiratory currently unable to assist this RN taking patient down to MRI and 24 hour MRI will be postponed to 2300. [facility] gave verbal approval for the scan to be completed at this time.   Verbal orders for blood pressure parameters to be SBP <180 after 2200 and a-line may be removed.   Care ongoing.  Wyn Quaker, RN
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
## 2022-04-22 NOTE — Progress Notes (Signed)
Referring Physician(s): CODE STROKE  Supervising Physician: Luanne Bras  Patient Status:  [facility] - In-pt  Chief Complaint: Left MCA infarct s/p revascularization in IR 04/07/2022 by Dr. Estanislado Pandy.   HPI: 71 year old male with a history of anxiety, HTN, right BKA, COPD, MI and hepatitis C. He presented to the [facility] ED 04/06/2022 as a code stroke after he fell striking his right forehead and was noted by wife to be paralyzed on the right with speech difficulties. On arrival to the ED he had a right facial droop, left gaze deviation, right hemiplegia and global aphasia. Imaging was positive for a large left cerebral hemisphere stroke secondary to a thrombus and TNK was administered. He was also seen in NIR for a cerebral angiogram with intervention. He was found to have an occluded left internal carotid artery terminus, left anterior cerebral artery A1 segment and left middle cerebral artery M1 segment. He is s/p complete revascularization.   Subjective: Sedated/ventilator.   Allergies: Patient has no known allergies.  Medications: Prior to Admission medications   Medication Sig Start Date End Date Taking? Authorizing Provider  ALPRAZolam Duanne Moron) 1 MG tablet Take 2 mg by mouth at bedtime. 04/18/2022  Yes [provider]  atenolol (TENORMIN) 50 MG tablet Take 50 mg by mouth daily. 03/13/22  Yes [provider]  hydrALAZINE (APRESOLINE) 50 MG tablet Take 50 mg by mouth 2 (two) times daily. Patient not taking: Reported on 04/15/2022 03/13/22   [provider]     Vital Signs: BP (!) 167/71   Pulse 89   Temp 98.9 F (37.2 C) (Axillary)   Resp (!) 24   Ht '5\' 9"'$  (1.753 m)   Wt 134 lb 0.6 oz (60.8 kg)   SpO2 94%   BMI 19.79 kg/m   Physical Exam Constitutional:      Comments: Intubated/sedated  Cardiovascular:     Comments: Left groin vascular site is clean/soft/dry Pulmonary:     Comments: Intubated/sedated Musculoskeletal:     Comments: Right BKA   Skin:    General: Skin is warm and dry.     Imaging: DG CHEST PORT 1 VIEW  Result Date: 04/22/2022 CLINICAL DATA:  OG tube placement EXAM: PORTABLE CHEST 1 VIEW COMPARISON:  None available FINDINGS: Endotracheal tube is 5.5 cm above the carina. OG tube is in the fundus of the stomach. Heart is normal size. Right lung clear. Left lower lobe atelectasis or infiltrate. No acute bony abnormality. IMPRESSION: Left lower lobe atelectasis or infiltrate. Electronically Signed   By: Rolm Baptise M.D.   On: 04/22/2022 01:26   CT HEAD CODE STROKE WO CONTRAST  Result Date: 05/04/2022 CLINICAL DATA:  Code stroke. EXAM: CT HEAD WITHOUT CONTRAST TECHNIQUE: Contiguous axial images were obtained from the base of the skull through the vertex without intravenous contrast. RADIATION DOSE REDUCTION: This exam was performed according to the departmental dose-optimization program which includes automated exposure control, adjustment of the mA and/or kV according to patient size and/or use of iterative reconstruction technique. COMPARISON:  Prior MRI from 02/26/2021. FINDINGS: Brain: Age-related cerebral atrophy with chronic small vessel ischemic disease. Remote lacunar infarcts present at the left basal ganglia. No acute intracranial hemorrhage. Subtle loss of gray-white matter differentiation seen involving the left insular cortex as well as the overlying supra ganglionic left cerebral hemisphere, concerning for evolving acute early left MCA distribution infarct. No mass lesion, mass effect, or midline shift. No hydrocephalus or extra-axial fluid collection. Vascular: Asymmetric hyperdensity seen involving the left ICA  terminus/left M1 segment, concerning for large vessel occlusion. Skull: Scalp soft tissues and calvarium within normal limits. Sinuses/Orbits: Globes and orbital soft tissues demonstrate no acute finding. Paranasal sinuses and mastoid air cells are clear. Other: None. ASPECTS [facility] Stroke Program Early  CT Score) - Ganglionic level infarction (caudate, lentiform nuclei, internal capsule, insula, M1-M3 cortex): 6 - Supraganglionic infarction (M4-M6 cortex): 1 Total score (0-10 with 10 being normal): 7 IMPRESSION: 1. Hyperdense left ICA terminus/left M1 segment, concerning for thrombus/LVO. Probable subtle early evolving left MCA distribution infarct involving the left insula and overlying supra ganglionic left cerebral hemisphere. No intracranial hemorrhage. 2. ASPECTS is 7. These results were communicated to Dr. Cheral Marker at 9:39 pm on 04/08/2022 by text page via the [facility] messaging system. Electronically Signed   By: Jeannine Boga M.D.   On: 04/08/2022 21:43    Labs:  CBC: Recent Labs    04/06/2022 2121 04/30/2022 2321 04/22/22 0248 04/22/22 0540  WBC 10.1  --   --  10.8*  HGB 13.4 9.5* 10.5* 10.6*  HCT 40.2 28.0* 31.0* 31.2*  PLT 299  --   --  235    COAGS: Recent Labs    04/12/2022 2121  INR 1.0  APTT 33    BMP: Recent Labs    05/04/2022 2120 04/17/2022 2121 05/04/2022 2321 04/22/22 0248 04/22/22 0540  NA 138 137 136 136 134*  K 4.1 4.1 3.6 3.5 3.9  CL 104 105  --   --  105  CO2  --  20*  --   --  19*  GLUCOSE 98 104*  --   --  126*  BUN 33* 30*  --   --  34*  CALCIUM  --  9.0  --   --  7.8*  CREATININE 2.60* 2.47*  --   --  2.55*  GFRNONAA  --  27*  --   --  26*    LIVER FUNCTION TESTS: Recent Labs    05/01/2022 2121  BILITOT 0.8  AST 25  ALT 15  ALKPHOS 68  PROT 7.2  ALBUMIN 4.0    Assessment and Plan:  Left MCA infarct s/p revascularization in IR 04/14/2022 by Dr. Estanislado Pandy.  Code Stroke s/p TNK and cerebral angiogram with intervention. He was found to have an occluded left internal carotid artery terminus, left anterior cerebral artery A1 segment and left middle cerebral artery M1 segment. He is s/p complete revascularization  Patient remains intubated/sedated. Per bedside RN he is moving the left side of his body and will withdraw from pain on the right.  Left CFA vascular site is clean, soft and dry.   MRA/MR Brain pending. Plans per CCM for breathing trials.   NIR will continue to follow.   Electronically Signed: Soyla Dryer, AGACNP-BC [phone] 04/22/2022, 11:58 AM   I spent a total of 15 Minutes at the the patient's bedside AND on the patient's hospital floor or unit, greater than 50% of which was counseling/coordinating care for left MCA infarct s/p revascularization.

## 2022-04-22 NOTE — Sedation Documentation (Signed)
Laceration noted to right ear. Active bleeding noted, covered with gauze
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
## 2022-04-22 NOTE — Anesthesia Postprocedure Evaluation (Signed)
Anesthesia Post Note  Patient: Jermaine Gonzalez  Procedure(s) Performed: IR WITH ANESTHESIA     Patient location during evaluation: SICU Anesthesia Type: General Level of consciousness: sedated Pain management: pain level controlled Vital Signs Assessment: post-procedure vital signs reviewed and stable Respiratory status: patient remains intubated per anesthesia plan Cardiovascular status: stable Postop Assessment: no apparent nausea or vomiting Anesthetic complications: no   No notable events documented.  Last Vitals:  Vitals:   04/22/22 0630 04/22/22 0631  BP: 124/67   Pulse: 74 76  Resp: 14 15  Temp: 36.5 C   SpO2: 99% 98%    Last Pain:  Vitals:   04/22/22 0630  TempSrc: Axillary                 Santa Lighter

## 2022-04-22 NOTE — Progress Notes (Signed)
OT Cancellation Note  Patient Details Name: Jermaine Gonzalez MRN: [id number] DOB: 04-15-1951   Cancelled Treatment:    Reason Eval/Treat Not Completed: Active bedrest order (Will return as schedule allows.)  [place], OTR/L Acute Rehab Office: [phone] 04/22/2022, 7:24 AM

## 2022-04-22 NOTE — Transfer of Care (Signed)
Immediate Anesthesia Transfer of Care Note  Patient: Jermaine Gonzalez  Procedure(s) Performed: IR WITH ANESTHESIA  Patient Location: NICU  Anesthesia Type:General  Level of Consciousness: Patient remains intubated per anesthesia plan  Airway & Oxygen Therapy: Patient placed on Ventilator (see vital sign flow sheet for setting)  Post-op Assessment: Report given to RN and Post -op Vital signs reviewed and stable  Post vital signs: Reviewed and stable  Last Vitals:  Vitals Value Taken Time  BP 137/57   Temp    Pulse 75 04/22/22 0053  Resp 17 04/22/22 0053  SpO2 99 % 04/22/22 0053  Vitals shown include unvalidated device data.  Last Pain: There were no vitals filed for this visit.       Complications: No notable events documented.

## 2022-04-23 DIAGNOSIS — I63312 Cerebral infarction due to thrombosis of left middle cerebral artery: Secondary | ICD-10-CM | POA: Diagnosis not present

## 2022-04-23 DIAGNOSIS — F17210 Nicotine dependence, cigarettes, uncomplicated: Secondary | ICD-10-CM

## 2022-04-23 DIAGNOSIS — N179 Acute kidney failure, unspecified: Secondary | ICD-10-CM

## 2022-04-23 DIAGNOSIS — G911 Obstructive hydrocephalus: Secondary | ICD-10-CM

## 2022-04-23 DIAGNOSIS — I6602 Occlusion and stenosis of left middle cerebral artery: Secondary | ICD-10-CM | POA: Diagnosis not present

## 2022-04-23 DIAGNOSIS — G935 Compression of brain: Secondary | ICD-10-CM | POA: Diagnosis not present

## 2022-04-23 DIAGNOSIS — F17200 Nicotine dependence, unspecified, uncomplicated: Secondary | ICD-10-CM

## 2022-04-23 DIAGNOSIS — Z9911 Dependence on respirator [ventilator] status: Secondary | ICD-10-CM | POA: Diagnosis not present

## 2022-04-23 DIAGNOSIS — I63512 Cerebral infarction due to unspecified occlusion or stenosis of left middle cerebral artery: Secondary | ICD-10-CM | POA: Diagnosis not present

## 2022-04-23 LAB — GLUCOSE, CAPILLARY
Glucose-Capillary: 121 mg/dL — ABNORMAL HIGH (ref 70–99)
Glucose-Capillary: 139 mg/dL — ABNORMAL HIGH (ref 70–99)
Glucose-Capillary: 145 mg/dL — ABNORMAL HIGH (ref 70–99)
Glucose-Capillary: 170 mg/dL — ABNORMAL HIGH (ref 70–99)
Glucose-Capillary: 174 mg/dL — ABNORMAL HIGH (ref 70–99)
Glucose-Capillary: 175 mg/dL — ABNORMAL HIGH (ref 70–99)
Glucose-Capillary: 53 mg/dL — ABNORMAL LOW (ref 70–99)
Glucose-Capillary: 56 mg/dL — ABNORMAL LOW (ref 70–99)
Glucose-Capillary: 84 mg/dL (ref 70–99)

## 2022-04-23 LAB — PHOSPHORUS: Phosphorus: 4.1 mg/dL (ref 2.5–4.6)

## 2022-04-23 LAB — BASIC METABOLIC PANEL
Anion gap: 13 (ref 5–15)
BUN: 42 mg/dL — ABNORMAL HIGH (ref 8–23)
CO2: 19 mmol/L — ABNORMAL LOW (ref 22–32)
Calcium: 8.1 mg/dL — ABNORMAL LOW (ref 8.9–10.3)
Chloride: 108 mmol/L (ref 98–111)
Creatinine, Ser: 3.23 mg/dL — ABNORMAL HIGH (ref 0.61–1.24)
GFR, Estimated: 20 mL/min — ABNORMAL LOW (ref >60)
Glucose, Bld: 161 mg/dL — ABNORMAL HIGH (ref 70–99)
Potassium: 3.9 mmol/L (ref 3.5–5.1)
Sodium: 140 mmol/L (ref 135–145)

## 2022-04-23 LAB — SODIUM
Sodium: 135 mmol/L (ref 135–145)
Sodium: 142 mmol/L (ref 135–145)
Sodium: 144 mmol/L (ref 135–145)

## 2022-04-23 LAB — CBC
HCT: 25.8 % — ABNORMAL LOW (ref 39.0–52.0)
Hemoglobin: 8.5 g/dL — ABNORMAL LOW (ref 13.0–17.0)
MCH: 29.1 pg (ref 26.0–34.0)
MCHC: 32.9 g/dL (ref 30.0–36.0)
MCV: 88.4 fL (ref 80.0–100.0)
Platelets: 181 10*3/uL (ref 150–400)
RBC: 2.92 MIL/uL — ABNORMAL LOW (ref 4.22–5.81)
RDW: 14.7 % (ref 11.5–15.5)
WBC: 9.6 10*3/uL (ref 4.0–10.5)
nRBC: 0 % (ref 0.0–0.2)

## 2022-04-23 LAB — TRIGLYCERIDES: Triglycerides: 60 mg/dL (ref <150)

## 2022-04-23 LAB — MAGNESIUM: Magnesium: 1.7 mg/dL (ref 1.7–2.4)

## 2022-04-23 MED ORDER — DEXTROSE 50 % IV SOLN: 25.0000 g | INTRAVENOUS | Status: AC

## 2022-04-23 MED ORDER — DEXTROSE 50 % IV SOLN
INTRAVENOUS | Status: AC
  Administered 2022-04-23: 25 g via INTRAVENOUS
  Filled 2022-04-23: qty 50

## 2022-04-23 MED ORDER — INSULIN ASPART 100 UNIT/ML IJ SOLN
0.0000 [IU] | INTRAMUSCULAR | Status: DC
  Administered 2022-04-23: 1 [IU] via SUBCUTANEOUS

## 2022-04-23 MED ORDER — SODIUM CHLORIDE 0.9 % IV BOLUS
500.0000 mL | Freq: Once | INTRAVENOUS | Status: AC
  Administered 2022-04-23: 500 mL via INTRAVENOUS

## 2022-04-23 MED ORDER — MAGNESIUM SULFATE 2 GM/50ML IV SOLN
2.0000 g | Freq: Once | INTRAVENOUS | Status: AC
  Administered 2022-04-23: 2 g via INTRAVENOUS
  Filled 2022-04-23: qty 50

## 2022-04-23 MED ORDER — PHENYLEPHRINE HCL-NACL 20-0.9 MG/250ML-% IV SOLN
25.0000 ug/min | INTRAVENOUS | Status: DC
  Administered 2022-04-23: 25 ug/min via INTRAVENOUS
  Filled 2022-04-23: qty 250

## 2022-04-23 MED ORDER — CLEVIDIPINE BUTYRATE 0.5 MG/ML IV EMUL: 0.0000 mg/h | INTRAVENOUS | Status: DC

## 2022-04-23 MED ORDER — SODIUM CHLORIDE 0.9 % IV SOLN: 250.0000 mL | INTRAVENOUS | Status: DC

## 2022-04-23 MED ORDER — SODIUM CHLORIDE 3 % IV SOLN
INTRAVENOUS | Status: DC
  Filled 2022-04-23 (×4): qty 500

## 2022-04-23 NOTE — Progress Notes (Addendum)
STROKE TEAM PROGRESS NOTE   INTERVAL HISTORY No family at the bedside. Remains intubated due to mental status. Decline in exam noted overnight with loss of most reflexes.  MRI reveals massive left sided infarct involving all of MCA and ACA territory with extensive edema, 1.6 cm midline shift and developing hydrocephalus.  Will discuss changes and prognosis with family, with likely transition to comfort care.  Vitals:   04/23/22 1000 04/23/22 1100 04/23/22 1145 04/23/22 1200  BP: 118/62 130/63 114/68 (!) 117/58  Pulse: (!) 54 (!) 53 (!) 51 (!) 48  Resp: '14 14 14 14  '$ Temp: (!) 93.2 F (34 C) (!) 94.3 F (34.6 C) (!) 94.3 F (34.6 C) (!) 94.5 F (34.7 C)  TempSrc: Esophageal Esophageal    SpO2: 100% 100% 100% 100%  Weight:      Height:       CBC:  Recent Labs  Lab 04/18/2022 2121 04/26/2022 2321 04/22/22 0540 04/23/22 0430  WBC 10.1  --  10.8* 9.6  NEUTROABS 5.9  --  8.9*  --   HGB 13.4   < > 10.6* 8.5*  HCT 40.2   < > 31.2* 25.8*  MCV 88.2  --  87.9 88.4  PLT 299  --  235 181   < > = values in this interval not displayed.    Basic Metabolic Panel:  Recent Labs  Lab 04/22/22 0540 04/22/22 1730 04/23/22 0111 04/23/22 0430 04/23/22 0733  NA 134*  --    < > 140 142  K 3.9  --   --  3.9  --   CL 105  --   --  108  --   CO2 19*  --   --  19*  --   GLUCOSE 126*  --   --  161*  --   BUN 34*  --   --  42*  --   CREATININE 2.55*  --   --  3.23*  --   CALCIUM 7.8*  --   --  8.1*  --   MG  --  2.0  --  1.7  --   PHOS  --  2.6  --  4.1  --    < > = values in this interval not displayed.    Lipid Panel:  Recent Labs  Lab 04/22/22 0540 04/23/22 0430  CHOL 170  --   TRIG 535* 60  HDL 28*  --   CHOLHDL 6.1  --   VLDL UNABLE TO CALCULATE IF TRIGLYCERIDE OVER 400 mg/dL  --   LDLCALC UNABLE TO CALCULATE IF TRIGLYCERIDE OVER 400 mg/dL  --     HgbA1c:  Recent Labs  Lab 04/22/22 0540  HGBA1C 5.2    Urine Drug Screen: No results for input(s): "LABOPIA", "COCAINSCRNUR",  "LABBENZ", "AMPHETMU", "THCU", "LABBARB" in the last 168 hours.  Alcohol Level  Recent Labs  Lab 05/05/2022 2121  ETH <10     IMAGING past 24 hours MR ANGIO HEAD WO CONTRAST  Result Date: 04/23/2022 CLINICAL DATA:  Follow-up examination for stroke. EXAM: MRI HEAD WITHOUT CONTRAST MRA HEAD WITHOUT CONTRAST TECHNIQUE: Multiplanar, multi-echo pulse sequences of the brain and surrounding structures were acquired without intravenous contrast. Angiographic images of the Circle of Willis were acquired using MRA technique without intravenous contrast. COMPARISON:  Multiple previous exams from earlier the same day. FINDINGS: MRI HEAD FINDINGS Brain: Extensive cytotoxic edema and restricted diffusion seen throughout the left cerebral hemisphere, with involvement of essentially the entirety of the left MCA  and ACA distributions. Involvement of the left basal ganglia and corpus callosum. Diffusion signal abnormality extends to involve the midbrain. Scattered foci of susceptibility artifact within the area of infarction consistent with petechial blood products Heidelberg classification 1a: HI1, scattered small petechiae, no mass effect. Associated extensive gyral swelling and edema with regional mass effect. Interval development of 1.6 cm of left-to-right shift. Asymmetric dilatation of the right lateral ventricle with evidence for transependymal flow of CSF at the occipital horn, consistent with ventricular trapping. Secondary mass effect on the midbrain with basilar cistern crowding. There are a few additional patchy small volume foci of acute ischemia involving the contralateral right cerebral hemisphere. Note is made of small volume diffusion signal abnormality involving the right sylvian fissure, nonspecific, but could reflect trace subarachnoid hemorrhage (series 5, image 74). This is not well seen on corresponding sequences. Underlying chronic microvascular ischemic disease. Few additional remote lacunar  infarcts noted about the right thalamus and right cerebellum. Small chronic right PCA distribution infarct noted. No mass lesion or extra-axial fluid collection. Vascular: Major intracranial vascular flow voids are grossly maintained at the skull base. Skull and upper cervical spine: Craniocervical junction within normal limits. Bone marrow signal intensity normal. No scalp soft tissue abnormality. Sinuses/Orbits: Prior bilateral ocular lens replacement. Paranasal sinuses are largely clear. Trace left mastoid effusion. Patient is intubated. Other: None. MRA HEAD FINDINGS Anterior circulation: Examination degraded by motion artifact. Visualized distal cervical segments of the internal carotid arteries are patent with antegrade flow. Right ICA widely patent through the siphon to the terminus. Left ICA becomes increasingly attenuated as it courses to the terminus, but remains grossly patent without visible stenosis. Right A1 segment. Left A1 attenuated but grossly patent. Grossly normal anterior communicating artery complex. Right ACA grossly patent to its distal aspect without visible stenosis. Left A2 segment grossly patent at its origin, but is not seen distally, which could be secondary to occlusion or edema. Markedly attenuated but patent flow seen within the left M1 segment. Distal left MCA branches attenuated but grossly patent. Right M1 segment widely patent. Distal right MCA branches well perfused. Posterior circulation: Both vertebral arteries patent without stenosis. Left vertebral artery dominant. Neither PICA origin well visualized. Basilar patent to its distal aspect without visible stenosis. Superior cerebellar arteries patent bilaterally. Both PCAs primarily supplied via the basilar. Probable severe right P2 stenosis noted (series 1043, image 13). Left PCA grossly patent at its origin, but is not seen distally, likely due to edema within the left cerebral hemisphere possible occlusion not excluded,  although no frank left PCA territory infarct is seen. Anatomic variants: None significant. IMPRESSION: MRI HEAD IMPRESSION: 1. Massive acute left cerebral infarction involving essentially the entirety of the left MCA and ACA distributions. Associated petechial blood products without frank hemorrhagic transformation. 2. Associated extensive edema and regional mass effect with interval development of 1.6 cm of left-to-right shift. 3. Asymmetric dilatation of the right lateral ventricle with evidence for transependymal flow of CSF at the occipital horn, consistent with ventricular trapping. 4. Few additional small foci of acute ischemia involving the contralateral right cerebral hemisphere. 5. Diffusion signal abnormality about the right sylvian fissure. While these could reflect ischemic changes, possible trace subarachnoid hemorrhage is difficult to exclude. Correlation with dedicated head CT suggested. 6. Underlying chronic microvascular ischemic disease with chronic right PCA territory infarct. MRA HEAD IMPRESSION: 1. Motion degraded exam. 2. Left ICA now patent to the terminus. Attenuated but grossly patent flow within the left MCA distally. Left ACA is  not visualized beyond the proximal left A2 segment, which could be related to edema and/or occlusion. 3. Nonvisualization of the left PCA beyond the proximal left P2 segment. Finding favored to be due to the left cerebral edema rather than occlusion given the lack of an associated sizable left PCA infarct. 4. 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 Per [facility], Dr. Cheral Marker of the neurology service is already aware of these findings at the time of this dictation. Electronically Signed   By: Jeannine Boga M.D.   On: 04/23/2022 04:45   MR BRAIN WO CONTRAST  Result Date: 04/23/2022 CLINICAL DATA:  Follow-up examination for stroke. EXAM: MRI HEAD WITHOUT CONTRAST MRA HEAD WITHOUT CONTRAST TECHNIQUE: Multiplanar, multi-echo pulse sequences of the brain and  surrounding structures were acquired without intravenous contrast. Angiographic images of the Circle of Willis were acquired using MRA technique without intravenous contrast. COMPARISON:  Multiple previous exams from earlier the same day. FINDINGS: MRI HEAD FINDINGS Brain: Extensive cytotoxic edema and restricted diffusion seen throughout the left cerebral hemisphere, with involvement of essentially the entirety of the left MCA and ACA distributions. Involvement of the left basal ganglia and corpus callosum. Diffusion signal abnormality extends to involve the midbrain. Scattered foci of susceptibility artifact within the area of infarction consistent with petechial blood products Heidelberg classification 1a: HI1, scattered small petechiae, no mass effect. Associated extensive gyral swelling and edema with regional mass effect. Interval development of 1.6 cm of left-to-right shift. Asymmetric dilatation of the right lateral ventricle with evidence for transependymal flow of CSF at the occipital horn, consistent with ventricular trapping. Secondary mass effect on the midbrain with basilar cistern crowding. There are a few additional patchy small volume foci of acute ischemia involving the contralateral right cerebral hemisphere. Note is made of small volume diffusion signal abnormality involving the right sylvian fissure, nonspecific, but could reflect trace subarachnoid hemorrhage (series 5, image 74). This is not well seen on corresponding sequences. Underlying chronic microvascular ischemic disease. Few additional remote lacunar infarcts noted about the right thalamus and right cerebellum. Small chronic right PCA distribution infarct noted. No mass lesion or extra-axial fluid collection. Vascular: Major intracranial vascular flow voids are grossly maintained at the skull base. Skull and upper cervical spine: Craniocervical junction within normal limits. Bone marrow signal intensity normal. No scalp soft tissue  abnormality. Sinuses/Orbits: Prior bilateral ocular lens replacement. Paranasal sinuses are largely clear. Trace left mastoid effusion. Patient is intubated. Other: None. MRA HEAD FINDINGS Anterior circulation: Examination degraded by motion artifact. Visualized distal cervical segments of the internal carotid arteries are patent with antegrade flow. Right ICA widely patent through the siphon to the terminus. Left ICA becomes increasingly attenuated as it courses to the terminus, but remains grossly patent without visible stenosis. Right A1 segment. Left A1 attenuated but grossly patent. Grossly normal anterior communicating artery complex. Right ACA grossly patent to its distal aspect without visible stenosis. Left A2 segment grossly patent at its origin, but is not seen distally, which could be secondary to occlusion or edema. Markedly attenuated but patent flow seen within the left M1 segment. Distal left MCA branches attenuated but grossly patent. Right M1 segment widely patent. Distal right MCA branches well perfused. Posterior circulation: Both vertebral arteries patent without stenosis. Left vertebral artery dominant. Neither PICA origin well visualized. Basilar patent to its distal aspect without visible stenosis. Superior cerebellar arteries patent bilaterally. Both PCAs primarily supplied via the basilar. Probable severe right P2 stenosis noted (series 1043, image 13). Left PCA grossly patent at  its origin, but is not seen distally, likely due to edema within the left cerebral hemisphere possible occlusion not excluded, although no frank left PCA territory infarct is seen. Anatomic variants: None significant. IMPRESSION: MRI HEAD IMPRESSION: 1. Massive acute left cerebral infarction involving essentially the entirety of the left MCA and ACA distributions. Associated petechial blood products without frank hemorrhagic transformation. 2. Associated extensive edema and regional mass effect with interval  development of 1.6 cm of left-to-right shift. 3. Asymmetric dilatation of the right lateral ventricle with evidence for transependymal flow of CSF at the occipital horn, consistent with ventricular trapping. 4. Few additional small foci of acute ischemia involving the contralateral right cerebral hemisphere. 5. Diffusion signal abnormality about the right sylvian fissure. While these could reflect ischemic changes, possible trace subarachnoid hemorrhage is difficult to exclude. Correlation with dedicated head CT suggested. 6. Underlying chronic microvascular ischemic disease with chronic right PCA territory infarct. MRA HEAD IMPRESSION: 1. Motion degraded exam. 2. Left ICA now patent to the terminus. Attenuated but grossly patent flow within the left MCA distally. Left ACA is not visualized beyond the proximal left A2 segment, which could be related to edema and/or occlusion. 3. Nonvisualization of the left PCA beyond the proximal left P2 segment. Finding favored to be due to the left cerebral edema rather than occlusion given the lack of an associated sizable left PCA infarct. 4. 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 Per [facility], Dr. Cheral Marker of the neurology service is already aware of these findings at the time of this dictation. Electronically Signed   By: Jeannine Boga M.D.   On: 04/23/2022 04:45   ECHOCARDIOGRAM COMPLETE  Result Date: 04/22/2022    ECHOCARDIOGRAM REPORT   Patient Name:   Jermaine Gonzalez [facility] Date of Exam: 04/22/2022 Medical Rec #:  [id number]     Height:       69.0 in Accession #:    [id number]    Weight:       134.0 lb Date of Birth:  18-May-1951     BSA:          1.743 m Patient Age:    89 years      BP:           154/73 mmHg Patient Gender: M             HR:           75 bpm. Exam Location:  Inpatient Procedure: 2D Echo Indications:    Stroke  History:        Patient has no prior history of Echocardiogram examinations.                 Stroke; Risk Factors:Current Smoker.  Sonographer:    Harvie Junior Referring Phys: [phone] ERIC LINDZEN  Sonographer Comments: Technically difficult study due to poor echo windows. Image acquisition challenging due to patient behavioral factors. and supine. IMPRESSIONS  1. Left ventricular ejection fraction, by estimation, is 60 to 65%. Left ventricular ejection fraction by PLAX is 61 %. The left ventricle has normal function. The left ventricle has no regional wall motion abnormalities. Left ventricular diastolic parameters are consistent with Grade I diastolic dysfunction (impaired relaxation).  2. Right ventricular systolic function is normal. The right ventricular size is normal. There is normal pulmonary artery systolic pressure. The estimated right ventricular systolic pressure is 07.3 mmHg.  3. The mitral valve is abnormal. Trivial mitral valve regurgitation.  4. The aortic valve was not well visualized. Aortic valve regurgitation is  trivial. Aortic valve sclerosis is present, with no evidence of aortic valve stenosis.  5. The inferior vena cava is normal in size with greater than 50% respiratory variability, suggesting right atrial pressure of 3 mmHg. Comparison(s): No prior Echocardiogram. FINDINGS  Left Ventricle: Left ventricular ejection fraction, by estimation, is 60 to 65%. Left ventricular ejection fraction by PLAX is 61 %. The left ventricle has normal function. The left ventricle has no regional wall motion abnormalities. The left ventricular internal cavity size was normal in size. There is no left ventricular hypertrophy. Left ventricular diastolic parameters are consistent with Grade I diastolic dysfunction (impaired relaxation). Indeterminate filling pressures. Right Ventricle: The right ventricular size is normal. No increase in right ventricular wall thickness. Right ventricular systolic function is normal. There is normal pulmonary artery systolic pressure. The tricuspid regurgitant velocity is 2.23 m/s, and  with an assumed right atrial pressure of 3  mmHg, the estimated right ventricular systolic pressure is 82.4 mmHg. Left Atrium: Left atrial size was normal in size. Right Atrium: Right atrial size was normal in size. Pericardium: There is no evidence of pericardial effusion. Mitral Valve: The mitral valve is abnormal. There is mild calcification of the mitral valve leaflet(s). Mild mitral annular calcification. Trivial mitral valve regurgitation. Tricuspid Valve: The tricuspid valve is grossly normal. Tricuspid valve regurgitation is trivial. Aortic Valve: The aortic valve was not well visualized. Aortic valve regurgitation is trivial. Aortic regurgitation PHT measures 418 msec. Aortic valve sclerosis is present, with no evidence of aortic valve stenosis. Aortic valve mean gradient measures 3.0 mmHg. Aortic valve peak gradient measures 7.4 mmHg. Aortic valve area, by VTI measures 2.70 cm. Pulmonic Valve: The pulmonic valve was grossly normal. Pulmonic valve regurgitation is trivial. Aorta: The aortic root and ascending aorta are structurally normal, with no evidence of dilitation. Venous: The inferior vena cava is normal in size with greater than 50% respiratory variability, suggesting right atrial pressure of 3 mmHg. IAS/Shunts: No atrial level shunt detected by color flow Doppler.  LEFT VENTRICLE PLAX 2D LV EF:         Left            Diastology                ventricular     LV e' medial:    7.29 cm/s                ejection        LV E/e' medial:  11.8                fraction by     LV e' lateral:   10.00 cm/s                PLAX is 61      LV E/e' lateral: 8.6                %. LVIDd:         4.90 cm LVIDs:         3.30 cm LV PW:         0.90 cm LV IVS:        0.90 cm LVOT diam:     1.90 cm LV SV:         65 LV SV Index:   38 LVOT Area:     2.84 cm  LV Volumes (MOD) LV vol d, MOD    51.8 ml A2C: LV vol d, MOD    86.5 ml A4C:  LV vol s, MOD    22.8 ml A2C: LV vol s, MOD    35.1 ml A4C: LV SV MOD A2C:   29.0 ml LV SV MOD A4C:   86.5 ml LV SV MOD BP:     42.7 ml RIGHT VENTRICLE RV Basal diam:  3.30 cm RV Mid diam:    2.80 cm RV S prime:     12.80 cm/s TAPSE (M-mode): 1.6 cm LEFT ATRIUM             Index        RIGHT ATRIUM           Index LA diam:        3.90 cm 2.24 cm/m   RA Area:     14.80 cm LA Vol (A2C):   37.9 ml 21.75 ml/m  RA Volume:   37.60 ml  21.57 ml/m LA Vol (A4C):   40.7 ml 23.35 ml/m LA Biplane Vol: 40.9 ml 23.47 ml/m  AORTIC VALVE                    PULMONIC VALVE AV Area (Vmax):    2.44 cm     PV Vmax:       1.11 m/s AV Area (Vmean):   2.76 cm     PV Peak grad:  4.9 mmHg AV Area (VTI):     2.70 cm AV Vmax:           136.00 cm/s AV Vmean:          79.800 cm/s AV VTI:            0.243 m AV Peak Grad:      7.4 mmHg AV Mean Grad:      3.0 mmHg LVOT Vmax:         117.00 cm/s LVOT Vmean:        77.600 cm/s LVOT VTI:          0.231 m LVOT/AV VTI ratio: 0.95 AI PHT:            418 msec  AORTA Ao Root diam: 3.20 cm Ao Asc diam:  2.80 cm MITRAL VALVE               TRICUSPID VALVE MV Area (PHT): 4.60 cm    TR Peak grad:   19.9 mmHg MV Decel Time: 165 msec    TR Vmax:        223.00 cm/s MR Peak grad: 50.3 mmHg MR Vmax:      354.50 cm/s  SHUNTS MV E velocity: 85.70 cm/s  Systemic VTI:  0.23 m MV A velocity: 88.80 cm/s  Systemic Diam: 1.90 cm MV E/A ratio:  0.97 Lyman Bishop MD Electronically signed by Lyman Bishop MD Signature Date/Time: 04/22/2022/3:52:23 PM    Final    VAS [place] LOWER EXTREMITY VENOUS (DVT)  Result Date: 04/22/2022  Lower Venous DVT Study Patient Name:  QUANTRELL SPLITT [facility]  Date of Exam:   04/22/2022 Medical Rec #: [id number]      Accession #:    [id number] Date of Birth: [birth year]      Patient Gender: M Patient Age:   64 years Exam Location:  [facility] Procedure:      VAS [place] LOWER EXTREMITY VENOUS (DVT) Referring Phys: Cornelius Moras Adasia Hoar --------------------------------------------------------------------------------  Indications: Stroke. Other Indications: Rt BKA. Comparison Study: no prior Performing Technologist: Archie Patten  RVS  Examination Guidelines: A complete evaluation includes B-mode imaging, spectral Doppler, color Doppler, and power  Doppler as needed of all accessible portions of each vessel. Bilateral testing is considered an integral part of a complete examination. Limited examinations for reoccurring indications may be performed as noted. The reflux portion of the exam is performed with the patient in reverse Trendelenburg.  +---------+---------------+---------+-----------+----------+--------------+ RIGHT    CompressibilityPhasicitySpontaneityPropertiesThrombus Aging +---------+---------------+---------+-----------+----------+--------------+ CFV      Full           Yes      Yes                                 +---------+---------------+---------+-----------+----------+--------------+ SFJ      Full                                                        +---------+---------------+---------+-----------+----------+--------------+ FV Prox  Full                                                        +---------+---------------+---------+-----------+----------+--------------+ FV Mid   Full                                                        +---------+---------------+---------+-----------+----------+--------------+ FV DistalFull                                                        +---------+---------------+---------+-----------+----------+--------------+ PFV      Full                                                        +---------+---------------+---------+-----------+----------+--------------+ POP      Full           Yes      Yes                                 +---------+---------------+---------+-----------+----------+--------------+ PTV                                                   BKA            +---------+---------------+---------+-----------+----------+--------------+ PERO                                                  BKA             +---------+---------------+---------+-----------+----------+--------------+   +---------+---------------+---------+-----------+----------+--------------+  LEFT     CompressibilityPhasicitySpontaneityPropertiesThrombus Aging +---------+---------------+---------+-----------+----------+--------------+ CFV      Full           Yes      Yes                                 +---------+---------------+---------+-----------+----------+--------------+ SFJ      Full                                                        +---------+---------------+---------+-----------+----------+--------------+ FV Prox  Full                                                        +---------+---------------+---------+-----------+----------+--------------+ FV Mid   Full                                                        +---------+---------------+---------+-----------+----------+--------------+ FV DistalFull                                                        +---------+---------------+---------+-----------+----------+--------------+ PFV      Full                                                        +---------+---------------+---------+-----------+----------+--------------+ POP      Full           Yes      Yes                                 +---------+---------------+---------+-----------+----------+--------------+ PTV      Full                                                        +---------+---------------+---------+-----------+----------+--------------+ PERO     Full                                                        +---------+---------------+---------+-----------+----------+--------------+     Summary: BILATERAL: - No evidence of deep vein thrombosis seen in the lower extremities, bilaterally. -No evidence of popliteal cyst, bilaterally.   *See table(s) above for measurements and observations. Electronically signed by Servando Snare MD on 04/22/2022 at 3:26:32  PM.  Final    US RENAL  Result Date: 04/22/2022 CLINICAL DATA:  Acute kidney injury EXAM: RENAL / URINARY TRACT ULTRASOUND COMPLETE COMPARISON:  Ultrasound abdomen 04/06/2014 FINDINGS: Right Kidney: Renal measurements: 10.0 x 4.3 x 4.2 cm = volume: 94 mL. Echogenicity within normal limits. No mass or hydronephrosis visualized. Left Kidney: Renal measurements: 11.2 x 5.5 x 5.2 cm = volume: 170 mL. Severe left hydronephrosis. No renal mass or stone. Bladder: Empty Other: None. IMPRESSION: No acute abnormality right kidney.  Decreased right renal volume. Severe left hydronephrosis, not present on the prior study 2015 Electronically Signed   By: Franchot Gallo M.D.   On: 04/22/2022 13:52    PHYSICAL EXAM  Physical Exam  Constitutional: Intubated, sedated, laceration on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

## 2022-04-23 NOTE — Progress Notes (Signed)
Pt transported to and from MRI without event. °

## 2022-04-23 NOTE — Consult Note (Signed)
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 fashion. Thereafter using modified Seldinger technique, transfemoral access into the left common femoral artery was obtained without difficulty. Over a 0.035 inch guidewire an 8 French 25 cm Pinnacle sheath was inserted. Through this, and also over an 0.035 inch guidewire a 5 [place] JB 1 catheter was advanced to the aortic arch region and selectively positioned in the left common carotid artery. FINDINGS: Left common carotid arteriogram demonstrates the left external carotid artery and its major branches to be widely patent. The left internal carotid artery at the bulb to the cranial skull base demonstrates slow ascent of contrast to the cranial skull base. More distally, the petrous and cavernous segments demonstrate  patency. 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 PROCEDURE: Over an 035 inch 300 cm Rosen exchange guidewire in the distal left internal carotid artery, a combination of Berenstein 5 [place] support catheter inside of a balloon guide catheter was advanced to the distal right internal carotid artery. The guidewire and the support catheter were removed. Good aspiration obtained from the hub of the balloon guide catheter. A gentle control arteriogram performed through the balloon guide catheter demonstrated no change in the intracranial circulation. Over an 014 inch standard Aristotle micro guidewire with a moderate J configuration, an 021 160 cm microcatheter was advanced inside of an 071 132 cm Zoom aspiration catheter to the supraclinoid right ICA. Using a torque device, the micro guidewire was then gently advanced without resistance to the M2 M3 region of the inferior branch of the left middle cerebral artery followed by the microcatheter. The guidewire was removed. Good aspiration obtained from the hub of the microcatheter. A gentle control arteriogram performed through this demonstrated safe positioning of the microcatheter which was then connected to continuous heparinized saline infusion. A 3 mm x 20 mm Solitaire X retrieval device was advanced to the distal end of the microcatheter and deployed in the usual manner. Proximal portion of the device was just inside the occluded left MCA. The Zoom aspiration catheter was then advanced to engage the left middle cerebral artery and the mid M2 segment. Constant aspiration was then applied at hub of the Zoom aspiration catheter and at the hub of the balloon guide catheter with proximal flow arrest for a minute. Thereafter, the combination of the retrieval device, the microcatheter and the Zoom aspiration catheter were retrieved and removed. Following reversal of flow arrest, a control  arteriogram performed through the balloon guide catheter demonstrated complete revascularization achieving a TICI 3 revascularization. Multiple areas of caliber irregularity in the M2 M3 MCA superior and inferior divisions probably represent intracranial arteriosclerosis. Moderate spasm in the proximal left MCA responded to 3 aliquots of 25 mcg of nitroglycerin with relief. A final control arteriogram performed through the balloon guide catheter in the proximal left internal carotid artery demonstrated wide patency of the left internal carotid artery extra cranially and intracranially. The left anterior cerebral artery, and  the left middle cerebral artery continued to demonstrate a TICI 3 revascularization. The balloon guide catheter was removed. The 8 French sheath was also removed with manual compression held with a quick clot at the left groin puncture site for 35 minutes for hemostasis. Distal pulses in the left foot remained present unchanged from prior to the procedure. A flat panel CT of the brain demonstrated no evidence of intracranial hemorrhage. Patient was left intubated to protect his airway due to his preprocedural medical condition. He was then transferred to the neuro ICU for post revascularization care. IMPRESSION: Status post endovascular complete revascularization occluded left internal carotid artery terminus, left middle cerebral artery M1 segment, and the left anterior cerebral A1 segment with 1 pass with a 3 mm x 20 mm Solitaire X retrieval device, and contact aspiration achieving a TICI 3 revascularization. PLAN: Follow-up as per referring MD. Electronically Signed   By: Luanne Bras M.D.   On: 04/23/2022 08:07   IR Angiogram Extremity Left  Result Date: 04/23/2022 INDICATION: New onset left gaze deviation, global aphasia, right-sided hemiplegia. Hyperdense distal left internal carotid artery/left MCA sign. EXAM: 1. EMERGENT LARGE VESSEL OCCLUSION THROMBOLYSIS (anterior  CIRCULATION) COMPARISON:  CT angiogram of the head and neck 04/06/2022. MEDICATIONS: Ancef 2 g IV antibiotic was administered within 1 hour of the procedure. ANESTHESIA/SEDATION: General anesthesia. CONTRAST:  Omnipaque 300 approximately 60 mL. FLUOROSCOPY TIME:  Fluoroscopy Time: 20 minutes 42 seconds (756 mGy). COMPLICATIONS: None immediate. TECHNIQUE: Following a full explanation of the procedure along with the potential associated complications, an informed witnessed consent was obtained. The risks of intracranial hemorrhage of 10%, worsening neurological deficit, ventilator dependency, death and inability to revascularize were all reviewed in detail with the patient's spouse. The patient was then put under general anesthesia by the Department of Anesthesiology at [facility]. The left groin was prepped and draped in the usual sterile fashion. Thereafter using modified Seldinger technique, transfemoral access into the left common femoral artery was obtained without difficulty. Over a 0.035 inch guidewire an 8 French 25 cm Pinnacle sheath was inserted. Through this, and also over an 0.035 inch guidewire a 5 [place] JB 1 catheter was advanced to the aortic arch region and selectively positioned in the left common carotid artery. FINDINGS: Left common carotid arteriogram demonstrates the left external carotid artery and its major branches to be widely patent. The left internal carotid artery at the bulb to the cranial skull base demonstrates slow ascent of contrast to the cranial skull base. More distally, the petrous and cavernous segments demonstrate patency. 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 PROCEDURE: Over an 035 inch 300 cm Rosen exchange guidewire in the distal left internal carotid artery, a combination of Berenstein 5 [place] support catheter inside of a balloon guide catheter was advanced to the distal right  internal carotid artery. The guidewire and the support catheter were removed. Good aspiration obtained from the hub of the balloon guide catheter. A gentle control arteriogram performed through the balloon guide catheter demonstrated no change in the intracranial circulation. Over an 014 inch standard Aristotle micro guidewire with a moderate J configuration, an 021 160 cm microcatheter was advanced inside of an 071 132 cm Zoom aspiration catheter to the supraclinoid right ICA. Using a torque device, the micro guidewire was then gently advanced without resistance to the M2 M3 region of the inferior branch of the left middle cerebral artery followed by the microcatheter.  The guidewire was removed. Good aspiration obtained from the hub of the microcatheter. A gentle control arteriogram performed through this demonstrated safe positioning of the microcatheter which was then connected to continuous heparinized saline infusion. A 3 mm x 20 mm Solitaire X retrieval device was advanced to the distal end of the microcatheter and deployed in the usual manner. Proximal portion of the device was just inside the occluded left MCA. The Zoom aspiration catheter was then advanced to engage the left middle cerebral artery and the mid M2 segment. Constant aspiration was then applied at hub of the Zoom aspiration catheter and at the hub of the balloon guide catheter with proximal flow arrest for a minute. Thereafter, the combination of the retrieval device, the microcatheter and the Zoom aspiration catheter were retrieved and removed. Following reversal of flow arrest, a control arteriogram performed through the balloon guide catheter demonstrated complete revascularization achieving a TICI 3 revascularization. Multiple areas of caliber irregularity in the M2 M3 MCA superior and inferior divisions probably represent intracranial arteriosclerosis. Moderate spasm in the proximal left MCA responded to 3 aliquots of 25 mcg of  nitroglycerin with relief. A final control arteriogram performed through the balloon guide catheter in the proximal left internal carotid artery demonstrated wide patency of the left internal carotid artery extra cranially and intracranially. The left anterior cerebral artery, and the left middle cerebral artery continued to demonstrate a TICI 3 revascularization. The balloon guide catheter was removed. The 8 French sheath was also removed with manual compression held with a quick clot at the left groin puncture site for 35 minutes for hemostasis. Distal pulses in the left foot remained present unchanged from prior to the procedure. A flat panel CT of the brain demonstrated no evidence of intracranial hemorrhage. Patient was left intubated to protect his airway due to his preprocedural medical condition. He was then transferred to the neuro ICU for post revascularization care. IMPRESSION: Status post endovascular complete revascularization occluded left internal carotid artery terminus, left middle cerebral artery M1 segment, and the left anterior cerebral A1 segment with 1 pass with a 3 mm x 20 mm Solitaire X retrieval device, and contact aspiration achieving a TICI 3 revascularization. PLAN: Follow-up as per referring MD. Electronically Signed   By: Luanne Bras M.D.   On: 04/23/2022 08:07   MR ANGIO HEAD WO CONTRAST  Result Date: 04/23/2022 CLINICAL DATA:  Follow-up examination for stroke. EXAM: MRI HEAD WITHOUT CONTRAST MRA HEAD WITHOUT CONTRAST TECHNIQUE: Multiplanar, multi-echo pulse sequences of the brain and surrounding structures were acquired without intravenous contrast. Angiographic images of the Circle of Willis were acquired using MRA technique without intravenous contrast. COMPARISON:  Multiple previous exams from earlier the same day. FINDINGS: MRI HEAD FINDINGS Brain: Extensive cytotoxic edema and restricted diffusion seen throughout the left cerebral hemisphere, with involvement of  essentially the entirety of the left MCA and ACA distributions. Involvement of the left basal ganglia and corpus callosum. Diffusion signal abnormality extends to involve the midbrain. Scattered foci of susceptibility artifact within the area of infarction consistent with petechial blood products Heidelberg classification 1a: HI1, scattered small petechiae, no mass effect. Associated extensive gyral swelling and edema with regional mass effect. Interval development of 1.6 cm of left-to-right shift. Asymmetric dilatation of the right lateral ventricle with evidence for transependymal flow of CSF at the occipital horn, consistent with ventricular trapping. Secondary mass effect on the midbrain with basilar cistern crowding. There are a few additional patchy small volume foci of acute ischemia involving  the contralateral right cerebral hemisphere. Note is made of small volume diffusion signal abnormality involving the right sylvian fissure, nonspecific, but could reflect trace subarachnoid hemorrhage (series 5, image 74). This is not well seen on corresponding sequences. Underlying chronic microvascular ischemic disease. Few additional remote lacunar infarcts noted about the right thalamus and right cerebellum. Small chronic right PCA distribution infarct noted. No mass lesion or extra-axial fluid collection. Vascular: Major intracranial vascular flow voids are grossly maintained at the skull base. Skull and upper cervical spine: Craniocervical junction within normal limits. Bone marrow signal intensity normal. No scalp soft tissue abnormality. Sinuses/Orbits: Prior bilateral ocular lens replacement. Paranasal sinuses are largely clear. Trace left mastoid effusion. Patient is intubated. Other: None. MRA HEAD FINDINGS Anterior circulation: Examination degraded by motion artifact. Visualized distal cervical segments of the internal carotid arteries are patent with antegrade flow. Right ICA widely patent through the  siphon to the terminus. Left ICA becomes increasingly attenuated as it courses to the terminus, but remains grossly patent without visible stenosis. Right A1 segment. Left A1 attenuated but grossly patent. Grossly normal anterior communicating artery complex. Right ACA grossly patent to its distal aspect without visible stenosis. Left A2 segment grossly patent at its origin, but is not seen distally, which could be secondary to occlusion or edema. Markedly attenuated but patent flow seen within the left M1 segment. Distal left MCA branches attenuated but grossly patent. Right M1 segment widely patent. Distal right MCA branches well perfused. Posterior circulation: Both vertebral arteries patent without stenosis. Left vertebral artery dominant. Neither PICA origin well visualized. Basilar patent to its distal aspect without visible stenosis. Superior cerebellar arteries patent bilaterally. Both PCAs primarily supplied via the basilar. Probable severe right P2 stenosis noted (series 1043, image 13). Left PCA grossly patent at its origin, but is not seen distally, likely due to edema within the left cerebral hemisphere possible occlusion not excluded, although no frank left PCA territory infarct is seen. Anatomic variants: None significant. IMPRESSION: MRI HEAD IMPRESSION: 1. Massive acute left cerebral infarction involving essentially the entirety of the left MCA and ACA distributions. Associated petechial blood products without frank hemorrhagic transformation. 2. Associated extensive edema and regional mass effect with interval development of 1.6 cm of left-to-right shift. 3. Asymmetric dilatation of the right lateral ventricle with evidence for transependymal flow of CSF at the occipital horn, consistent with ventricular trapping. 4. Few additional small foci of acute ischemia involving the contralateral right cerebral hemisphere. 5. Diffusion signal abnormality about the right sylvian fissure. While these could  reflect ischemic changes, possible trace subarachnoid hemorrhage is difficult to exclude. Correlation with dedicated head CT suggested. 6. Underlying chronic microvascular ischemic disease with chronic right PCA territory infarct. MRA HEAD IMPRESSION: 1. Motion degraded exam. 2. Left ICA now patent to the terminus. Attenuated but grossly patent flow within the left MCA distally. Left ACA is not visualized beyond the proximal left A2 segment, which could be related to edema and/or occlusion. 3. Nonvisualization of the left PCA beyond the proximal left P2 segment. Finding favored to be due to the left cerebral edema rather than occlusion given the lack of an associated sizable left PCA infarct. 4. 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 Per [facility], Dr. Cheral Marker of the neurology service is already aware of these findings at the time of this dictation. Electronically Signed   By: Jeannine Boga M.D.   On: 04/23/2022 04:45   MR BRAIN WO CONTRAST  Result Date: 04/23/2022 CLINICAL DATA:  Follow-up examination for stroke.  EXAM: MRI HEAD WITHOUT CONTRAST MRA HEAD WITHOUT CONTRAST TECHNIQUE: Multiplanar, multi-echo pulse sequences of the brain and surrounding structures were acquired without intravenous contrast. Angiographic images of the Circle of Willis were acquired using MRA technique without intravenous contrast. COMPARISON:  Multiple previous exams from earlier the same day. FINDINGS: MRI HEAD FINDINGS Brain: Extensive cytotoxic edema and restricted diffusion seen throughout the left cerebral hemisphere, with involvement of essentially the entirety of the left MCA and ACA distributions. Involvement of the left basal ganglia and corpus callosum. Diffusion signal abnormality extends to involve the midbrain. Scattered foci of susceptibility artifact within the area of infarction consistent with petechial blood products Heidelberg classification 1a: HI1, scattered small petechiae, no mass effect. Associated extensive  gyral swelling and edema with regional mass effect. Interval development of 1.6 cm of left-to-right shift. Asymmetric dilatation of the right lateral ventricle with evidence for transependymal flow of CSF at the occipital horn, consistent with ventricular trapping. Secondary mass effect on the midbrain with basilar cistern crowding. There are a few additional patchy small volume foci of acute ischemia involving the contralateral right cerebral hemisphere. Note is made of small volume diffusion signal abnormality involving the right sylvian fissure, nonspecific, but could reflect trace subarachnoid hemorrhage (series 5, image 74). This is not well seen on corresponding sequences. Underlying chronic microvascular ischemic disease. Few additional remote lacunar infarcts noted about the right thalamus and right cerebellum. Small chronic right PCA distribution infarct noted. No mass lesion or extra-axial fluid collection. Vascular: Major intracranial vascular flow voids are grossly maintained at the skull base. Skull and upper cervical spine: Craniocervical junction within normal limits. Bone marrow signal intensity normal. No scalp soft tissue abnormality. Sinuses/Orbits: Prior bilateral ocular lens replacement. Paranasal sinuses are largely clear. Trace left mastoid effusion. Patient is intubated. Other: None. MRA HEAD FINDINGS Anterior circulation: Examination degraded by motion artifact. Visualized distal cervical segments of the internal carotid arteries are patent with antegrade flow. Right ICA widely patent through the siphon to the terminus. Left ICA becomes increasingly attenuated as it courses to the terminus, but remains grossly patent without visible stenosis. Right A1 segment. Left A1 attenuated but grossly patent. Grossly normal anterior communicating artery complex. Right ACA grossly patent to its distal aspect without visible stenosis. Left A2 segment grossly patent at its origin, but is not seen  distally, which could be secondary to occlusion or edema. Markedly attenuated but patent flow seen within the left M1 segment. Distal left MCA branches attenuated but grossly patent. Right M1 segment widely patent. Distal right MCA branches well perfused. Posterior circulation: Both vertebral arteries patent without stenosis. Left vertebral artery dominant. Neither PICA origin well visualized. Basilar patent to its distal aspect without visible stenosis. Superior cerebellar arteries patent bilaterally. Both PCAs primarily supplied via the basilar. Probable severe right P2 stenosis noted (series 1043, image 13). Left PCA grossly patent at its origin, but is not seen distally, likely due to edema within the left cerebral hemisphere possible occlusion not excluded, although no frank left PCA territory infarct is seen. Anatomic variants: None significant. IMPRESSION: MRI HEAD IMPRESSION: 1. Massive acute left cerebral infarction involving essentially the entirety of the left MCA and ACA distributions. Associated petechial blood products without frank hemorrhagic transformation. 2. Associated extensive edema and regional mass effect with interval development of 1.6 cm of left-to-right shift. 3. Asymmetric dilatation of the right lateral ventricle with evidence for transependymal flow of CSF at the occipital horn, consistent with ventricular trapping. 4. Few additional small foci of  acute ischemia involving the contralateral right cerebral hemisphere. 5. Diffusion signal abnormality about the right sylvian fissure. While these could reflect ischemic changes, possible trace subarachnoid hemorrhage is difficult to exclude. Correlation with dedicated head CT suggested. 6. Underlying chronic microvascular ischemic disease with chronic right PCA territory infarct. MRA HEAD IMPRESSION: 1. Motion degraded exam. 2. Left ICA now patent to the terminus. Attenuated but grossly patent flow within the left MCA distally. Left ACA is  not visualized beyond the proximal left A2 segment, which could be related to edema and/or occlusion. 3. Nonvisualization of the left PCA beyond the proximal left P2 segment. Finding favored to be due to the left cerebral edema rather than occlusion given the lack of an associated sizable left PCA infarct. 4. 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 Per [facility], Dr. Cheral Marker of the neurology service is already aware of these findings at the time of this dictation. Electronically Signed   By: Jeannine Boga M.D.   On: 04/23/2022 04:45   ECHOCARDIOGRAM COMPLETE  Result Date: 04/22/2022    ECHOCARDIOGRAM REPORT   Patient Name:   Jermaine Gonzalez [facility] Date of Exam: 04/22/2022 Medical Rec #:  [id number]     Height:       69.0 in Accession #:    [id number]    Weight:       134.0 lb Date of Birth:  Mar 03, 1951     BSA:          1.743 m Patient Age:    59 years      BP:           154/73 mmHg Patient Gender: M             HR:           75 bpm. Exam Location:  Inpatient Procedure: 2D Echo Indications:    Stroke  History:        Patient has no prior history of Echocardiogram examinations.                 Stroke; Risk Factors:Current Smoker.  Sonographer:    Harvie Junior Referring Phys: [phone] ERIC LINDZEN  Sonographer Comments: Technically difficult study due to poor echo windows. Image acquisition challenging due to patient behavioral factors. and supine. IMPRESSIONS  1. Left ventricular ejection fraction, by estimation, is 60 to 65%. Left ventricular ejection fraction by PLAX is 61 %. The left ventricle has normal function. The left ventricle has no regional wall motion abnormalities. Left ventricular diastolic parameters are consistent with Grade I diastolic dysfunction (impaired relaxation).  2. Right ventricular systolic function is normal. The right ventricular size is normal. There is normal pulmonary artery systolic pressure. The estimated right ventricular systolic pressure is 84.6 mmHg.  3. The mitral valve is abnormal. Trivial  mitral valve regurgitation.  4. The aortic valve was not well visualized. Aortic valve regurgitation is trivial. Aortic valve sclerosis is present, with no evidence of aortic valve stenosis.  5. The inferior vena cava is normal in size with greater than 50% respiratory variability, suggesting right atrial pressure of 3 mmHg. Comparison(s): No prior Echocardiogram. FINDINGS  Left Ventricle: Left ventricular ejection fraction, by estimation, is 60 to 65%. Left ventricular ejection fraction by PLAX is 61 %. The left ventricle has normal function. The left ventricle has no regional wall motion abnormalities. The left ventricular internal cavity size was normal in size. There is no left ventricular hypertrophy. Left ventricular diastolic parameters are consistent with Grade I diastolic dysfunction (impaired relaxation). Indeterminate  filling pressures. Right Ventricle: The right ventricular size is normal. No increase in right ventricular wall thickness. Right ventricular systolic function is normal. There is normal pulmonary artery systolic pressure. The tricuspid regurgitant velocity is 2.23 m/s, and  with an assumed right atrial pressure of 3 mmHg, the estimated right ventricular systolic pressure is 57.3 mmHg. Left Atrium: Left atrial size was normal in size. Right Atrium: Right atrial size was normal in size. Pericardium: There is no evidence of pericardial effusion. Mitral Valve: The mitral valve is abnormal. There is mild calcification of the mitral valve leaflet(s). Mild mitral annular calcification. Trivial mitral valve regurgitation. Tricuspid Valve: The tricuspid valve is grossly normal. Tricuspid valve regurgitation is trivial. Aortic Valve: The aortic valve was not well visualized. Aortic valve regurgitation is trivial. Aortic regurgitation PHT measures 418 msec. Aortic valve sclerosis is present, with no evidence of aortic valve stenosis. Aortic valve mean gradient measures 3.0 mmHg. Aortic valve peak  gradient measures 7.4 mmHg. Aortic valve area, by VTI measures 2.70 cm. Pulmonic Valve: The pulmonic valve was grossly normal. Pulmonic valve regurgitation is trivial. Aorta: The aortic root and ascending aorta are structurally normal, with no evidence of dilitation. Venous: The inferior vena cava is normal in size with greater than 50% respiratory variability, suggesting right atrial pressure of 3 mmHg. IAS/Shunts: No atrial level shunt detected by color flow Doppler.  LEFT VENTRICLE PLAX 2D LV EF:         Left            Diastology                ventricular     LV e' medial:    7.29 cm/s                ejection        LV E/e' medial:  11.8                fraction by     LV e' lateral:   10.00 cm/s                PLAX is 61      LV E/e' lateral: 8.6                %. LVIDd:         4.90 cm LVIDs:         3.30 cm LV PW:         0.90 cm LV IVS:        0.90 cm LVOT diam:     1.90 cm LV SV:         65 LV SV Index:   38 LVOT Area:     2.84 cm  LV Volumes (MOD) LV vol d, MOD    51.8 ml A2C: LV vol d, MOD    86.5 ml A4C: LV vol s, MOD    22.8 ml A2C: LV vol s, MOD    35.1 ml A4C: LV SV MOD A2C:   29.0 ml LV SV MOD A4C:   86.5 ml LV SV MOD BP:    42.7 ml RIGHT VENTRICLE RV Basal diam:  3.30 cm RV Mid diam:    2.80 cm RV S prime:     12.80 cm/s TAPSE (M-mode): 1.6 cm LEFT ATRIUM             Index        RIGHT ATRIUM  Index LA diam:        3.90 cm 2.24 cm/m   RA Area:     14.80 cm LA Vol (A2C):   37.9 ml 21.75 ml/m  RA Volume:   37.60 ml  21.57 ml/m LA Vol (A4C):   40.7 ml 23.35 ml/m LA Biplane Vol: 40.9 ml 23.47 ml/m  AORTIC VALVE                    PULMONIC VALVE AV Area (Vmax):    2.44 cm     PV Vmax:       1.11 m/s AV Area (Vmean):   2.76 cm     PV Peak grad:  4.9 mmHg AV Area (VTI):     2.70 cm AV Vmax:           136.00 cm/s AV Vmean:          79.800 cm/s AV VTI:            0.243 m AV Peak Grad:      7.4 mmHg AV Mean Grad:      3.0 mmHg LVOT Vmax:         117.00 cm/s LVOT Vmean:        77.600 cm/s  LVOT VTI:          0.231 m LVOT/AV VTI ratio: 0.95 AI PHT:            418 msec  AORTA Ao Root diam: 3.20 cm Ao Asc diam:  2.80 cm MITRAL VALVE               TRICUSPID VALVE MV Area (PHT): 4.60 cm    TR Peak grad:   19.9 mmHg MV Decel Time: 165 msec    TR Vmax:        223.00 cm/s MR Peak grad: 50.3 mmHg MR Vmax:      354.50 cm/s  SHUNTS MV E velocity: 85.70 cm/s  Systemic VTI:  0.23 m MV A velocity: 88.80 cm/s  Systemic Diam: 1.90 cm MV E/A ratio:  0.97 Lyman Bishop MD Electronically signed by Lyman Bishop MD Signature Date/Time: 04/22/2022/3:52:23 PM    Final    VAS [place] LOWER EXTREMITY VENOUS (DVT)  Result Date: 04/22/2022  Lower Venous DVT Study Patient Name:  Jermaine Gonzalez [facility]  Date of Exam:   04/22/2022 Medical Rec #: [id number]      Accession #:    [id number] Date of Birth: 1951-05-21      Patient Gender: M Patient Age:   5 years Exam Location:  [facility] Procedure:      VAS [place] LOWER EXTREMITY VENOUS (DVT) Referring Phys: Cornelius Moras XU --------------------------------------------------------------------------------  Indications: Stroke. Other Indications: Rt BKA. Comparison Study: no prior Performing Technologist: Archie Patten RVS  Examination Guidelines: A complete evaluation includes B-mode imaging, spectral Doppler, color Doppler, and power Doppler as needed of all accessible portions of each vessel. Bilateral testing is considered an integral part of a complete examination. Limited examinations for reoccurring indications may be performed as noted. The reflux portion of the exam is performed with the patient in reverse Trendelenburg.  +---------+---------------+---------+-----------+----------+--------------+ RIGHT    CompressibilityPhasicitySpontaneityPropertiesThrombus Aging +---------+---------------+---------+-----------+----------+--------------+ CFV      Full           Yes      Yes                                  +---------+---------------+---------+-----------+----------+--------------+  SFJ      Full                                                        +---------+---------------+---------+-----------+----------+--------------+ FV Prox  Full                                                        +---------+---------------+---------+-----------+----------+--------------+ FV Mid   Full                                                        +---------+---------------+---------+-----------+----------+--------------+ FV DistalFull                                                        +---------+---------------+---------+-----------+----------+--------------+ PFV      Full                                                        +---------+---------------+---------+-----------+----------+--------------+ POP      Full           Yes      Yes                                 +---------+---------------+---------+-----------+----------+--------------+ PTV                                                   BKA            +---------+---------------+---------+-----------+----------+--------------+ PERO                                                  BKA            +---------+---------------+---------+-----------+----------+--------------+   +---------+---------------+---------+-----------+----------+--------------+ LEFT     CompressibilityPhasicitySpontaneityPropertiesThrombus Aging +---------+---------------+---------+-----------+----------+--------------+ CFV      Full           Yes      Yes                                 +---------+---------------+---------+-----------+----------+--------------+ SFJ      Full                                                        +---------+---------------+---------+-----------+----------+--------------+  FV Prox  Full                                                         +---------+---------------+---------+-----------+----------+--------------+ FV Mid   Full                                                        +---------+---------------+---------+-----------+----------+--------------+ FV DistalFull                                                        +---------+---------------+---------+-----------+----------+--------------+ PFV      Full                                                        +---------+---------------+---------+-----------+----------+--------------+ POP      Full           Yes      Yes                                 +---------+---------------+---------+-----------+----------+--------------+ PTV      Full                                                        +---------+---------------+---------+-----------+----------+--------------+ PERO     Full                                                        +---------+---------------+---------+-----------+----------+--------------+     Summary: BILATERAL: - No evidence of deep vein thrombosis seen in the lower extremities, bilaterally. -No evidence of popliteal cyst, bilaterally.   *See table(s) above for measurements and observations. Electronically signed by Servando Snare MD on 04/22/2022 at 3:26:32 PM.    Final    US RENAL  Result Date: 04/22/2022 CLINICAL DATA:  Acute kidney injury EXAM: RENAL / URINARY TRACT ULTRASOUND COMPLETE COMPARISON:  Ultrasound abdomen 04/06/2014 FINDINGS: Right Kidney: Renal measurements: 10.0 x 4.3 x 4.2 cm = volume: 94 mL. Echogenicity within normal limits. No mass or hydronephrosis visualized. Left Kidney: Renal measurements: 11.2 x 5.5 x 5.2 cm = volume: 170 mL. Severe left hydronephrosis. No renal mass or stone. Bladder: Empty Other: None. IMPRESSION: No acute abnormality right kidney.  Decreased right renal volume. Severe left hydronephrosis, not present on the prior study 2015 Electronically Signed   By: Franchot Gallo M.D.   On:  04/22/2022 13:52   DG CHEST PORT 1 VIEW  Result Date: 04/22/2022 CLINICAL DATA:  OG tube placement EXAM: PORTABLE CHEST 1 VIEW COMPARISON:  None available FINDINGS: Endotracheal tube is 5.5 cm above the carina. OG tube is in the fundus of the stomach. Heart is normal size. Right lung clear. Left lower lobe atelectasis or infiltrate. No acute bony abnormality. IMPRESSION: Left lower lobe atelectasis or infiltrate. Electronically Signed   By: Rolm Baptise M.D.   On: 04/22/2022 01:26   CT HEAD CODE STROKE WO CONTRAST  Result Date: 05/03/2022 CLINICAL DATA:  Code stroke. EXAM: CT HEAD WITHOUT CONTRAST TECHNIQUE: Contiguous axial images were obtained from the base of the skull through the vertex without intravenous contrast. RADIATION DOSE REDUCTION: This exam was performed according to the departmental dose-optimization program which includes automated exposure control, adjustment of the mA and/or kV according to patient size and/or use of iterative reconstruction technique. COMPARISON:  Prior MRI from 02/26/2021. FINDINGS: Brain: Age-related cerebral atrophy with chronic small vessel ischemic disease. Remote lacunar infarcts present at the left basal ganglia. No acute intracranial hemorrhage. Subtle loss of gray-white matter differentiation seen involving the left insular cortex as well as the overlying supra ganglionic left cerebral hemisphere, concerning for evolving acute early left MCA distribution infarct. No mass lesion, mass effect, or midline shift. No hydrocephalus or extra-axial fluid collection. Vascular: Asymmetric hyperdensity seen involving the left ICA terminus/left M1 segment, concerning for large vessel occlusion. Skull: Scalp soft tissues and calvarium within normal limits. Sinuses/Orbits: Globes and orbital soft tissues demonstrate no acute finding. Paranasal sinuses and mastoid air cells are clear. Other: None. ASPECTS [facility] Stroke Program Early CT Score) - Ganglionic level infarction  (caudate, lentiform nuclei, internal capsule, insula, M1-M3 cortex): 6 - Supraganglionic infarction (M4-M6 cortex): 1 Total score (0-10 with 10 being normal): 7 IMPRESSION: 1. Hyperdense left ICA terminus/left M1 segment, concerning for thrombus/LVO. Probable subtle early evolving left MCA distribution infarct involving the left insula and overlying supra ganglionic left cerebral hemisphere. No intracranial hemorrhage. 2. ASPECTS is 7. These results were communicated to Dr. Cheral Marker at 9:39 pm on 04/19/2022 by text page via the [facility] messaging system. Electronically Signed   By: Jeannine Boga M.D.   On: 04/15/2022 21:43    Review of Systems  Unable to perform ROS: Acuity of condition   Blood pressure 112/66, pulse (!) 52, temperature (!) 95 F (35 C), resp. rate 14, height '5\' 9"'$  (1.753 m), weight 60.8 kg, SpO2 100 %. Physical Exam: Patient is debated and unresponsive.  He is not on any sedation.  He is not following commands.  No eye opening to noxious stimuli.  No movement to noxious stimuli in his bilateral upper or bilateral lower extremities.  Right BKA present.  Absent cough, gag, corneal, oculocephalic reflex.  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  The patient's neurological status continued to decline.  MRI of the brain was obtained and revealed the previously mentioned left MCA and ACA territory infarct that is essentially involving the entire distributions.  No hemorrhagic transformation was appreciated.  There is significant associated edema and mass effect with approximately 1.6 cm of left-to-right midline shift.  Due to the severity of the infarct and cerebral edema, the patient has an overall poor prognosis.  No acute neurosurgical intervention is indicated.  I would recommend continued supportive care.   Marvis Moeller, DNP, AGNP-C Neurosurgery Nurse Practitioner  [facility] [place] [address]  [address], [place], [place] [address] P: [phone]    F: [phone]  04/23/2022 8:03 AM

## 2022-04-23 NOTE — Progress Notes (Signed)
This chaplain responded to the unit page for spiritual care. The chaplain understands the Pt. wife-Dorriann received communication from medical team of the Pt. major life transition.  The Pt. wife-Dorriann and Dorriann's daughter are at the Pt. bedside. The chaplain listened reflectively as Dorriann accepted the chaplain's invitation for story telling. The chaplain understands the Pt. faith and attentiveness to scripture guided the Pt. life. Both Pt. and Dorriann share similar religious beliefs.   The chaplain affirmed Dorriann's relationship with the Pt. and how she knows what gives the Pt. life meaning when Dorriann asked about making the decision to discontinue aggressive Pt. care. Doriann responded, "I know the Pt. doesn't want to live like a vegetable."  The remainder of the time together Dorriann was able to articulate her grief and look to God for support.  Dorriann accepted the Pt. invitation for prayer and F/U spritual care.  Chaplain Sallyanne Kuster [phone]

## 2022-04-23 NOTE — Progress Notes (Signed)
NAME:  Jermaine Gonzalez, MRN:  [id number], DOB:  1951-06-17, LOS: 2 ADMISSION DATE:  04/19/2022, CONSULTATION DATE:  04/22/2022 REFERRING MD:  Genevie Cheshire, CHIEF COMPLAINT:  CVA   History of Present Illness:  71 year old man who presented to [facility] ED 10/16 as a Code Stroke after a fall at home with R-sided weakness and aphasia.  EMS noted R facial droop, left gaze deviation, R hemiplegia and global aphasia with incontinence. LKN 2000 10/16. Patient's wife noted multiple falls over the past 5 weeks. PMHx significant for HTN, COPD, CAD and MI, skin Ca, HEP C and remote EtOH use.  CT Head showed large L cerebral lesion with hyperdense left ICA terminus/left M1 segment, concerning for thrombus/LVO. Patient was given TNK and taken to [facility] for thrombectomy.   Arteriogram showed occluded left internal carotid artery terminus, left ACA A1 segment, and left MCA M1 segment. Revascularized with 1 pass, TICI 3.  Due to patient's agitation, he was left intubated after procedure.  Labs were significant for Cr 2.4, wife noted recent dark urine.  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  Interim History / Subjective:  Post-procedure day #1 from NIR mechanical thrombectomy/TNK administration Overnight, acute neuro status change noted by RN around 0300 with loss of pupillary  reactivity Loss of cough/gag noted around 0400 D/w Neuro overnight, no indication for repeat head imaging as MRI/MRA demonstrated devastating infarcts of L ACA/L MCA territories with 1.6cm L to R midline shift Continues on HTS 3% Attempted to contact patient's wife multiple times overnight for update Per Dr. Erlinda Hong (Stroke team), plan for [place] discussion with wife today  Objective:  Blood pressure 125/66, pulse 67, temperature 100 F (37.8 C), temperature source Axillary, resp. rate 14, height '5\' 9"'$  (1.753 m), weight 60.8 kg, SpO2 92 %.    Vent Mode: PRVC FiO2 (%):  [40 %] 40 % Set Rate:  [14 bmp] 14 bmp Vt Set:  [560 mL] 560 mL PEEP:  [5 cmH20] 5 cmH20 Pressure Support:  [8 cmH20] 8 cmH20 Plateau Pressure:  [14 cmH20-15 cmH20] 15 cmH20   Intake/Output Summary (Last 24 hours) at 04/23/2022 0740 Last data filed at 04/23/2022 0600 Gross per 24 hour  Intake 2829.76 ml  Output 350 ml  Net 2479.76 ml    Filed Weights   04/23/2022 2100  Weight: 60.8 kg   Physical Examination: General: Acute-on-chronically ill-appearing elderly man in NAD. Unresponsive. HEENT: [place]/AT, anicteric sclera, pupils fixed and dilated, 10m bilaterlly nonreactive, moist mucous membranes. ETT in place. Neuro: Comatose. Does not respond to verbal, tactile or noxious stimuli. Weak triple flexion response in LLE, no other response to pain. Not following commands. No spontaneous movement of extremities noted. No cough/gag or corneals. CV: Irregularly irregular rhythm, rate 50s, no m/g/r. PULM: Breathing even and unlabored on vent (PEEP 5, FiO2 40%). Lung fields  CTAB. GI: Soft, nontender, nondistended. Normoactive bowel sounds. Extremities: No LE edema noted. R BKA noted. Skin: Warm/dry, no rashes.  Resolved Hospital Problem List:     Assessment & Plan:   Post-op ventilator management  Acute  L MCA CVA Left anterior cerebral artery A1 segment, and left middle cerebral artery M1 segment LVO Brain compression  secondary to cerebral edema s/p ischemic stroke CT Head 10/16 with hyperdense left ICA terminus/left M1 segment, concerning for thrombus/LVO; no ICH. TNK given. Proceeded to [facility] for thrombectomy; TICI 3 recanalization achieved. MRI/MRA 10/18 AM with massive acute L cerebral infarction involving essentially the entirety of the left MCA and ACA distributions; associated petechial blood products without frank HT; associated extensive edema and regional mass effect with interval development of 1.6 cm of left-to-right shift. - Stroke team primary, appreciate assistance with management - MRI/MRA 10/18 with devastating neurologic injury of L ACA/L MCA territories; new 1.6cm shift - Serial neuro exams worsening with loss of brainstem reflexes, ?impending herniation - HTS 3% at 70m/hr for cerebral edema - Goal SBP 130-150 per NIR; BP labile in the setting of edema/brain compression; Cleviprex off now requiring Neosynephrine to maintain MAP goal - Per Stroke team, no indication for repeat brain imaging as MRI/MRA obtained 10/18 early AM already with catastrophic L ACA/L MCA territory infarcts despite NIR/TNK - GOC discussion with patient's family pending  Renal insufficiency, presumed acute on chronic NAMGA Baseline Cr likely ~2.0 (last Cr 2.0 at [facility] 2021). - Trend BMP - Replete electrolytes as indicated - Monitor I&Os - Avoid nephrotoxic agents as able - Ensure adequate renal perfusion  Acute hypoxemic respiratory failure in the setting of ischemic stroke History of COPD - Continue full vent support (4-8cc/kg IBW) - Wean FiO2 for O2 sat > 90% - Unable to complete  SAT/SBT given worsening neurologic exam - VAP bundle - Bronchodilators as ordered (DuoNeb scheduled, albuterol PRN) - Pulmonary hygiene - PAD protocol for sedation: Off sedation at present, remains comatose  HTN - BP extremely labile in the setting of worsening cerebral edema/brain compression - Cleviprex titrated off, briefly  required Neo - Goal SBP 130-150 per NIR  Hypertriglyceridemia, iatrogenic - Off of Propofol, Cleviprex at present  [place] with devastating neurologic injury and large area of ischemia affecting L ACA/L MCA territories. Severe cerebral edema/brain compression with 1.6cm midline shift. Further worsening neurologic status with concern for impending herniation. - GOC discussion with family, per Stroke team today - Recommend DNR code status at minimum  Best Practice: (right click and "Reselect all SmartList Selections" daily)   Diet/type: NPO DVT prophylaxis: SCD GI prophylaxis: PPI Lines: N/A Foley:  N/A Code Status:  full code Last date of multidisciplinary goals of care discussion [Per Primary Team]  Critical care time:   The patient is critically ill with multiple organ system failure and requires high complexity decision making for assessment and support, frequent evaluation and titration of therapies, advanced monitoring, review of radiographic studies and interpretation of complex data.   Critical Care Time devoted to patient care services, exclusive of separately billable procedures, described in this note is 41 minutes.  SLestine Mount PA-C [facility] 04/23/22 7:40 AM  Please see [email or web address] for pager details.  From 7A-7P if no response, please call [phone] After hours, please call ELink [phone]

## 2022-04-23 NOTE — Progress Notes (Signed)
[facility] Progress Note    Assessment/ Plan:   AKI on CKD4 Severe left hydronephrosis -given his baseline Cr of 2 back in 2021 -Cr worsened today and with severe left hydronephrosis. Primary service is trying to reach out to family given his current clinical situation. Plan is for palliation as per primary service once they can get in touch with family. Therefore, will hold off on CT renal stone + urology consultation for now. If aggressive measures are to be pursued can move forward with CT, urology, and foley placement -given risk for CIN, would recommend continuing with fluids for now -no indication for renal replacement therapy especially given aforementioned issues -Avoid nephrotoxic medications including NSAIDs and iodinated intravenous contrast exposure unless the latter is absolutely indicated.  Preferred narcotic agents for pain control are hydromorphone, fentanyl, and methadone. Morphine should not be used. Avoid Baclofen and avoid oral sodium phosphate and magnesium citrate based laxatives / bowel preps. Continue strict Input and Output monitoring. Will monitor the patient closely with you and intervene or adjust therapy as indicated by changes in clinical status/labs    Left MCA CVA, now with brain compression/herniation -s/p thrombectomy and TNK 10/16 -per primary -poor prognosis, primary service attempting to reach family   Hypertension: -required cleviprex gtt, now off and now requiring pressors   Metabolic acidosis -started nahco3, stable   Anemia due to chronic disease: -Transfuse for Hgb<7 g/dL -hgb worsening, w/u per primary service   Post op ventilator -per PCCM  Discussed with Dr. Erlinda Hong and ICU RN.  Gean Quint, MD [facility]  Subjective:   Acute neurologic status change overnight, loss of pupillary reflexes/cough/gag.  Pupils are fixed and dilated.  Staff have been unable to reach family overnight. Discussed with Dr. Erlinda Hong, plan  is for palliative/comfort care given severity of situation and likely not survivable. MRI overnight did reveal extensive edema with regional mass effect (left to right shift)   Objective:   BP 114/68   Pulse (!) 51   Temp (!) 94.3 F (34.6 C)   Resp 14   Ht '5\' 9"'$  (1.753 m)   Wt 60.8 kg   SpO2 100%   BMI 19.79 kg/m   Intake/Output Summary (Last 24 hours) at 04/23/2022 1225 Last data filed at 04/23/2022 1100 Gross per 24 hour  Intake 2358.77 ml  Output 350 ml  Net 2008.77 ml   Weight change:   Physical Exam: Gen: ill appearing CVS: s1s2, rrr Resp: cta bl, intubated Abd: soft, nt/nd Ext: no edema Neuro: unresponsive, pupils fix/dilated  Imaging: IR PERCUTANEOUS ART THROMBECTOMY/INFUSION INTRACRANIAL INC DIAG ANGIO  Result Date: 04/23/2022 INDICATION: New onset left gaze deviation, global aphasia, right-sided hemiplegia. Hyperdense distal left internal carotid artery/left MCA sign. EXAM: 1. EMERGENT LARGE VESSEL OCCLUSION THROMBOLYSIS (anterior CIRCULATION) COMPARISON:  CT angiogram of the head and neck 04/17/2022. MEDICATIONS: Ancef 2 g IV antibiotic was administered within 1 hour of the procedure. ANESTHESIA/SEDATION: General anesthesia. CONTRAST:  Omnipaque 300 approximately 60 mL. FLUOROSCOPY TIME:  Fluoroscopy Time: 20 minutes 42 seconds (756 mGy). COMPLICATIONS: None immediate. TECHNIQUE: Following a full explanation of the procedure along with the potential associated complications, an informed witnessed consent was obtained. The risks of intracranial hemorrhage of 10%, worsening neurological deficit, ventilator dependency, death and inability to revascularize were all reviewed in detail with the patient's spouse. The patient was then put under general anesthesia by the Department of Anesthesiology at [facility]. The left groin was prepped and draped in the usual sterile fashion.  Thereafter using modified Seldinger technique, transfemoral access into the left common  femoral artery was obtained without difficulty. Over a 0.035 inch guidewire an 8 French 25 cm Pinnacle sheath was inserted. Through this, and also over an 0.035 inch guidewire a 5 [place] JB 1 catheter was advanced to the aortic arch region and selectively positioned in the left common carotid artery. FINDINGS: Left common carotid arteriogram demonstrates the left external carotid artery and its major branches to be widely patent. The left internal carotid artery at the bulb to the cranial skull base demonstrates slow ascent of contrast to the cranial skull base. More distally, the petrous and cavernous segments demonstrate patency. 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 PROCEDURE: Over an 035 inch 300 cm Rosen exchange guidewire in the distal left internal carotid artery, a combination of Berenstein 5 [place] support catheter inside of a balloon guide catheter was advanced to the distal right internal carotid artery. The guidewire and the support catheter were removed. Good aspiration obtained from the hub of the balloon guide catheter. A gentle control arteriogram performed through the balloon guide catheter demonstrated no change in the intracranial circulation. Over an 014 inch standard Aristotle micro guidewire with a moderate J configuration, an 021 160 cm microcatheter was advanced inside of an 071 132 cm Zoom aspiration catheter to the supraclinoid right ICA. Using a torque device, the micro guidewire was then gently advanced without resistance to the M2 M3 region of the inferior branch of the left middle cerebral artery followed by the microcatheter. The guidewire was removed. Good aspiration obtained from the hub of the microcatheter. A gentle control arteriogram performed through this demonstrated safe positioning of the microcatheter which was then connected to continuous heparinized saline infusion. A 3 mm x 20 mm  Solitaire X retrieval device was advanced to the distal end of the microcatheter and deployed in the usual manner. Proximal portion of the device was just inside the occluded left MCA. The Zoom aspiration catheter was then advanced to engage the left middle cerebral artery and the mid M2 segment. Constant aspiration was then applied at hub of the Zoom aspiration catheter and at the hub of the balloon guide catheter with proximal flow arrest for a minute. Thereafter, the combination of the retrieval device, the microcatheter and the Zoom aspiration catheter were retrieved and removed. Following reversal of flow arrest, a control arteriogram performed through the balloon guide catheter demonstrated complete revascularization achieving a TICI 3 revascularization. Multiple areas of caliber irregularity in the M2 M3 MCA superior and inferior divisions probably represent intracranial arteriosclerosis. Moderate spasm in the proximal left MCA responded to 3 aliquots of 25 mcg of nitroglycerin with relief. A final control arteriogram performed through the balloon guide catheter in the proximal left internal carotid artery demonstrated wide patency of the left internal carotid artery extra cranially and intracranially. The left anterior cerebral artery, and the left middle cerebral artery continued to demonstrate a TICI 3 revascularization. The balloon guide catheter was removed. The 8 French sheath was also removed with manual compression held with a quick clot at the left groin puncture site for 35 minutes for hemostasis. Distal pulses in the left foot remained present unchanged from prior to the procedure. A flat panel CT of the brain demonstrated no evidence of intracranial hemorrhage. Patient was left intubated to protect his airway due to his preprocedural medical condition. He was then transferred to the neuro ICU  for post revascularization care. IMPRESSION: Status post endovascular complete revascularization occluded  left internal carotid artery terminus, left middle cerebral artery M1 segment, and the left anterior cerebral A1 segment with 1 pass with a 3 mm x 20 mm Solitaire X retrieval device, and contact aspiration achieving a TICI 3 revascularization. PLAN: Follow-up as per referring MD. Electronically Signed   By: Luanne Bras M.D.   On: 04/23/2022 08:07   IR CT Head Ltd  Result Date: 04/23/2022 INDICATION: New onset left gaze deviation, global aphasia, right-sided hemiplegia. Hyperdense distal left internal carotid artery/left MCA sign. EXAM: 1. EMERGENT LARGE VESSEL OCCLUSION THROMBOLYSIS (anterior CIRCULATION) COMPARISON:  CT angiogram of the head and neck 04/11/2022. MEDICATIONS: Ancef 2 g IV antibiotic was administered within 1 hour of the procedure. ANESTHESIA/SEDATION: General anesthesia. CONTRAST:  Omnipaque 300 approximately 60 mL. FLUOROSCOPY TIME:  Fluoroscopy Time: 20 minutes 42 seconds (756 mGy). COMPLICATIONS: None immediate. TECHNIQUE: Following a full explanation of the procedure along with the potential associated complications, an informed witnessed consent was obtained. The risks of intracranial hemorrhage of 10%, worsening neurological deficit, ventilator dependency, death and inability to revascularize were all reviewed in detail with the patient's spouse. The patient was then put under general anesthesia by the Department of Anesthesiology at [facility]. The left groin was prepped and draped in the usual sterile fashion. Thereafter using modified Seldinger technique, transfemoral access into the left common femoral artery was obtained without difficulty. Over a 0.035 inch guidewire an 8 French 25 cm Pinnacle sheath was inserted. Through this, and also over an 0.035 inch guidewire a 5 [place] JB 1 catheter was advanced to the aortic arch region and selectively positioned in the left common carotid artery. FINDINGS: Left common carotid arteriogram demonstrates the left external  carotid artery and its major branches to be widely patent. The left internal carotid artery at the bulb to the cranial skull base demonstrates slow ascent of contrast to the cranial skull base. More distally, the petrous and cavernous segments demonstrate patency. 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 PROCEDURE: Over an 035 inch 300 cm Rosen exchange guidewire in the distal left internal carotid artery, a combination of Berenstein 5 [place] support catheter inside of a balloon guide catheter was advanced to the distal right internal carotid artery. The guidewire and the support catheter were removed. Good aspiration obtained from the hub of the balloon guide catheter. A gentle control arteriogram performed through the balloon guide catheter demonstrated no change in the intracranial circulation. Over an 014 inch standard Aristotle micro guidewire with a moderate J configuration, an 021 160 cm microcatheter was advanced inside of an 071 132 cm Zoom aspiration catheter to the supraclinoid right ICA. Using a torque device, the micro guidewire was then gently advanced without resistance to the M2 M3 region of the inferior branch of the left middle cerebral artery followed by the microcatheter. The guidewire was removed. Good aspiration obtained from the hub of the microcatheter. A gentle control arteriogram performed through this demonstrated safe positioning of the microcatheter which was then connected to continuous heparinized saline infusion. A 3 mm x 20 mm Solitaire X retrieval device was advanced to the distal end of the microcatheter and deployed in the usual manner. Proximal portion of the device was just inside the occluded left MCA. The Zoom aspiration catheter was then advanced to engage the left middle cerebral artery and the mid M2 segment. Constant aspiration  was then applied at hub of the Zoom aspiration catheter and at  the hub of the balloon guide catheter with proximal flow arrest for a minute. Thereafter, the combination of the retrieval device, the microcatheter and the Zoom aspiration catheter were retrieved and removed. Following reversal of flow arrest, a control arteriogram performed through the balloon guide catheter demonstrated complete revascularization achieving a TICI 3 revascularization. Multiple areas of caliber irregularity in the M2 M3 MCA superior and inferior divisions probably represent intracranial arteriosclerosis. Moderate spasm in the proximal left MCA responded to 3 aliquots of 25 mcg of nitroglycerin with relief. A final control arteriogram performed through the balloon guide catheter in the proximal left internal carotid artery demonstrated wide patency of the left internal carotid artery extra cranially and intracranially. The left anterior cerebral artery, and the left middle cerebral artery continued to demonstrate a TICI 3 revascularization. The balloon guide catheter was removed. The 8 French sheath was also removed with manual compression held with a quick clot at the left groin puncture site for 35 minutes for hemostasis. Distal pulses in the left foot remained present unchanged from prior to the procedure. A flat panel CT of the brain demonstrated no evidence of intracranial hemorrhage. Patient was left intubated to protect his airway due to his preprocedural medical condition. He was then transferred to the neuro ICU for post revascularization care. IMPRESSION: Status post endovascular complete revascularization occluded left internal carotid artery terminus, left middle cerebral artery M1 segment, and the left anterior cerebral A1 segment with 1 pass with a 3 mm x 20 mm Solitaire X retrieval device, and contact aspiration achieving a TICI 3 revascularization. PLAN: Follow-up as per referring MD. Electronically Signed   By: Luanne Bras M.D.   On: 04/23/2022 08:07   IR Angiogram  Extremity Left  Result Date: 04/23/2022 INDICATION: New onset left gaze deviation, global aphasia, right-sided hemiplegia. Hyperdense distal left internal carotid artery/left MCA sign. EXAM: 1. EMERGENT LARGE VESSEL OCCLUSION THROMBOLYSIS (anterior CIRCULATION) COMPARISON:  CT angiogram of the head and neck 04/14/2022. MEDICATIONS: Ancef 2 g IV antibiotic was administered within 1 hour of the procedure. ANESTHESIA/SEDATION: General anesthesia. CONTRAST:  Omnipaque 300 approximately 60 mL. FLUOROSCOPY TIME:  Fluoroscopy Time: 20 minutes 42 seconds (756 mGy). COMPLICATIONS: None immediate. TECHNIQUE: Following a full explanation of the procedure along with the potential associated complications, an informed witnessed consent was obtained. The risks of intracranial hemorrhage of 10%, worsening neurological deficit, ventilator dependency, death and inability to revascularize were all reviewed in detail with the patient's spouse. The patient was then put under general anesthesia by the Department of Anesthesiology at [facility]. The left groin was prepped and draped in the usual sterile fashion. Thereafter using modified Seldinger technique, transfemoral access into the left common femoral artery was obtained without difficulty. Over a 0.035 inch guidewire an 8 French 25 cm Pinnacle sheath was inserted. Through this, and also over an 0.035 inch guidewire a 5 [place] JB 1 catheter was advanced to the aortic arch region and selectively positioned in the left common carotid artery. FINDINGS: Left common carotid arteriogram demonstrates the left external carotid artery and its major branches to be widely patent. The left internal carotid artery at the bulb to the cranial skull base demonstrates slow ascent of contrast to the cranial skull base. More distally, the petrous and cavernous segments demonstrate patency. 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 PROCEDURE: Over an  035 inch 300 cm Rosen exchange guidewire in the distal left internal carotid artery, a combination of Berenstein 5 [place] support catheter inside of a balloon guide catheter was advanced to the distal right internal carotid artery. The guidewire and the support catheter were removed. Good aspiration obtained from the hub of the balloon guide catheter. A gentle control arteriogram performed through the balloon guide catheter demonstrated no change in the intracranial circulation. Over an 014 inch standard Aristotle micro guidewire with a moderate J configuration, an 021 160 cm microcatheter was advanced inside of an 071 132 cm Zoom aspiration catheter to the supraclinoid right ICA. Using a torque device, the micro guidewire was then gently advanced without resistance to the M2 M3 region of the inferior branch of the left middle cerebral artery followed by the microcatheter. The guidewire was removed. Good aspiration obtained from the hub of the microcatheter. A gentle control arteriogram performed through this demonstrated safe positioning of the microcatheter which was then connected to continuous heparinized saline infusion. A 3 mm x 20 mm Solitaire X retrieval device was advanced to the distal end of the microcatheter and deployed in the usual manner. Proximal portion of the device was just inside the occluded left MCA. The Zoom aspiration catheter was then advanced to engage the left middle cerebral artery and the mid M2 segment. Constant aspiration was then applied at hub of the Zoom aspiration catheter and at the hub of the balloon guide catheter with proximal flow arrest for a minute. Thereafter, the combination of the retrieval device, the microcatheter and the Zoom aspiration catheter were retrieved and removed. Following reversal of flow arrest, a control arteriogram performed through the balloon guide catheter demonstrated complete revascularization  achieving a TICI 3 revascularization. Multiple areas of caliber irregularity in the M2 M3 MCA superior and inferior divisions probably represent intracranial arteriosclerosis. Moderate spasm in the proximal left MCA responded to 3 aliquots of 25 mcg of nitroglycerin with relief. A final control arteriogram performed through the balloon guide catheter in the proximal left internal carotid artery demonstrated wide patency of the left internal carotid artery extra cranially and intracranially. The left anterior cerebral artery, and the left middle cerebral artery continued to demonstrate a TICI 3 revascularization. The balloon guide catheter was removed. The 8 French sheath was also removed with manual compression held with a quick clot at the left groin puncture site for 35 minutes for hemostasis. Distal pulses in the left foot remained present unchanged from prior to the procedure. A flat panel CT of the brain demonstrated no evidence of intracranial hemorrhage. Patient was left intubated to protect his airway due to his preprocedural medical condition. He was then transferred to the neuro ICU for post revascularization care. IMPRESSION: Status post endovascular complete revascularization occluded left internal carotid artery terminus, left middle cerebral artery M1 segment, and the left anterior cerebral A1 segment with 1 pass with a 3 mm x 20 mm Solitaire X retrieval device, and contact aspiration achieving a TICI 3 revascularization. PLAN: Follow-up as per referring MD. Electronically Signed   By: Luanne Bras M.D.   On: 04/23/2022 08:07   MR ANGIO HEAD WO CONTRAST  Result Date: 04/23/2022 CLINICAL DATA:  Follow-up examination for stroke. EXAM: MRI HEAD WITHOUT CONTRAST MRA HEAD WITHOUT CONTRAST TECHNIQUE: Multiplanar, multi-echo pulse sequences of the brain and surrounding structures were acquired without intravenous contrast. Angiographic images of the Circle of Willis were acquired using MRA  technique without intravenous contrast. COMPARISON:  Multiple previous exams from  earlier the same day. FINDINGS: MRI HEAD FINDINGS Brain: Extensive cytotoxic edema and restricted diffusion seen throughout the left cerebral hemisphere, with involvement of essentially the entirety of the left MCA and ACA distributions. Involvement of the left basal ganglia and corpus callosum. Diffusion signal abnormality extends to involve the midbrain. Scattered foci of susceptibility artifact within the area of infarction consistent with petechial blood products Heidelberg classification 1a: HI1, scattered small petechiae, no mass effect. Associated extensive gyral swelling and edema with regional mass effect. Interval development of 1.6 cm of left-to-right shift. Asymmetric dilatation of the right lateral ventricle with evidence for transependymal flow of CSF at the occipital horn, consistent with ventricular trapping. Secondary mass effect on the midbrain with basilar cistern crowding. There are a few additional patchy small volume foci of acute ischemia involving the contralateral right cerebral hemisphere. Note is made of small volume diffusion signal abnormality involving the right sylvian fissure, nonspecific, but could reflect trace subarachnoid hemorrhage (series 5, image 74). This is not well seen on corresponding sequences. Underlying chronic microvascular ischemic disease. Few additional remote lacunar infarcts noted about the right thalamus and right cerebellum. Small chronic right PCA distribution infarct noted. No mass lesion or extra-axial fluid collection. Vascular: Major intracranial vascular flow voids are grossly maintained at the skull base. Skull and upper cervical spine: Craniocervical junction within normal limits. Bone marrow signal intensity normal. No scalp soft tissue abnormality. Sinuses/Orbits: Prior bilateral ocular lens replacement. Paranasal sinuses are largely clear. Trace left mastoid effusion.  Patient is intubated. Other: None. MRA HEAD FINDINGS Anterior circulation: Examination degraded by motion artifact. Visualized distal cervical segments of the internal carotid arteries are patent with antegrade flow. Right ICA widely patent through the siphon to the terminus. Left ICA becomes increasingly attenuated as it courses to the terminus, but remains grossly patent without visible stenosis. Right A1 segment. Left A1 attenuated but grossly patent. Grossly normal anterior communicating artery complex. Right ACA grossly patent to its distal aspect without visible stenosis. Left A2 segment grossly patent at its origin, but is not seen distally, which could be secondary to occlusion or edema. Markedly attenuated but patent flow seen within the left M1 segment. Distal left MCA branches attenuated but grossly patent. Right M1 segment widely patent. Distal right MCA branches well perfused. Posterior circulation: Both vertebral arteries patent without stenosis. Left vertebral artery dominant. Neither PICA origin well visualized. Basilar patent to its distal aspect without visible stenosis. Superior cerebellar arteries patent bilaterally. Both PCAs primarily supplied via the basilar. Probable severe right P2 stenosis noted (series 1043, image 13). Left PCA grossly patent at its origin, but is not seen distally, likely due to edema within the left cerebral hemisphere possible occlusion not excluded, although no frank left PCA territory infarct is seen. Anatomic variants: None significant. IMPRESSION: MRI HEAD IMPRESSION: 1. Massive acute left cerebral infarction involving essentially the entirety of the left MCA and ACA distributions. Associated petechial blood products without frank hemorrhagic transformation. 2. Associated extensive edema and regional mass effect with interval development of 1.6 cm of left-to-right shift. 3. Asymmetric dilatation of the right lateral ventricle with evidence for transependymal flow of  CSF at the occipital horn, consistent with ventricular trapping. 4. Few additional small foci of acute ischemia involving the contralateral right cerebral hemisphere. 5. Diffusion signal abnormality about the right sylvian fissure. While these could reflect ischemic changes, possible trace subarachnoid hemorrhage is difficult to exclude. Correlation with dedicated head CT suggested. 6. Underlying chronic microvascular ischemic disease with chronic right  PCA territory infarct. MRA HEAD IMPRESSION: 1. Motion degraded exam. 2. Left ICA now patent to the terminus. Attenuated but grossly patent flow within the left MCA distally. Left ACA is not visualized beyond the proximal left A2 segment, which could be related to edema and/or occlusion. 3. Nonvisualization of the left PCA beyond the proximal left P2 segment. Finding favored to be due to the left cerebral edema rather than occlusion given the lack of an associated sizable left PCA infarct. 4. 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 Per [facility], Dr. Cheral Marker of the neurology service is already aware of these findings at the time of this dictation. Electronically Signed   By: Jeannine Boga M.D.   On: 04/23/2022 04:45   MR BRAIN WO CONTRAST  Result Date: 04/23/2022 CLINICAL DATA:  Follow-up examination for stroke. EXAM: MRI HEAD WITHOUT CONTRAST MRA HEAD WITHOUT CONTRAST TECHNIQUE: Multiplanar, multi-echo pulse sequences of the brain and surrounding structures were acquired without intravenous contrast. Angiographic images of the Circle of Willis were acquired using MRA technique without intravenous contrast. COMPARISON:  Multiple previous exams from earlier the same day. FINDINGS: MRI HEAD FINDINGS Brain: Extensive cytotoxic edema and restricted diffusion seen throughout the left cerebral hemisphere, with involvement of essentially the entirety of the left MCA and ACA distributions. Involvement of the left basal ganglia and corpus callosum. Diffusion signal  abnormality extends to involve the midbrain. Scattered foci of susceptibility artifact within the area of infarction consistent with petechial blood products Heidelberg classification 1a: HI1, scattered small petechiae, no mass effect. Associated extensive gyral swelling and edema with regional mass effect. Interval development of 1.6 cm of left-to-right shift. Asymmetric dilatation of the right lateral ventricle with evidence for transependymal flow of CSF at the occipital horn, consistent with ventricular trapping. Secondary mass effect on the midbrain with basilar cistern crowding. There are a few additional patchy small volume foci of acute ischemia involving the contralateral right cerebral hemisphere. Note is made of small volume diffusion signal abnormality involving the right sylvian fissure, nonspecific, but could reflect trace subarachnoid hemorrhage (series 5, image 74). This is not well seen on corresponding sequences. Underlying chronic microvascular ischemic disease. Few additional remote lacunar infarcts noted about the right thalamus and right cerebellum. Small chronic right PCA distribution infarct noted. No mass lesion or extra-axial fluid collection. Vascular: Major intracranial vascular flow voids are grossly maintained at the skull base. Skull and upper cervical spine: Craniocervical junction within normal limits. Bone marrow signal intensity normal. No scalp soft tissue abnormality. Sinuses/Orbits: Prior bilateral ocular lens replacement. Paranasal sinuses are largely clear. Trace left mastoid effusion. Patient is intubated. Other: None. MRA HEAD FINDINGS Anterior circulation: Examination degraded by motion artifact. Visualized distal cervical segments of the internal carotid arteries are patent with antegrade flow. Right ICA widely patent through the siphon to the terminus. Left ICA becomes increasingly attenuated as it courses to the terminus, but remains grossly patent without visible  stenosis. Right A1 segment. Left A1 attenuated but grossly patent. Grossly normal anterior communicating artery complex. Right ACA grossly patent to its distal aspect without visible stenosis. Left A2 segment grossly patent at its origin, but is not seen distally, which could be secondary to occlusion or edema. Markedly attenuated but patent flow seen within the left M1 segment. Distal left MCA branches attenuated but grossly patent. Right M1 segment widely patent. Distal right MCA branches well perfused. Posterior circulation: Both vertebral arteries patent without stenosis. Left vertebral artery dominant. Neither PICA origin well visualized. Basilar patent to its distal  aspect without visible stenosis. Superior cerebellar arteries patent bilaterally. Both PCAs primarily supplied via the basilar. Probable severe right P2 stenosis noted (series 1043, image 13). Left PCA grossly patent at its origin, but is not seen distally, likely due to edema within the left cerebral hemisphere possible occlusion not excluded, although no frank left PCA territory infarct is seen. Anatomic variants: None significant. IMPRESSION: MRI HEAD IMPRESSION: 1. Massive acute left cerebral infarction involving essentially the entirety of the left MCA and ACA distributions. Associated petechial blood products without frank hemorrhagic transformation. 2. Associated extensive edema and regional mass effect with interval development of 1.6 cm of left-to-right shift. 3. Asymmetric dilatation of the right lateral ventricle with evidence for transependymal flow of CSF at the occipital horn, consistent with ventricular trapping. 4. Few additional small foci of acute ischemia involving the contralateral right cerebral hemisphere. 5. Diffusion signal abnormality about the right sylvian fissure. While these could reflect ischemic changes, possible trace subarachnoid hemorrhage is difficult to exclude. Correlation with dedicated head CT suggested. 6.  Underlying chronic microvascular ischemic disease with chronic right PCA territory infarct. MRA HEAD IMPRESSION: 1. Motion degraded exam. 2. Left ICA now patent to the terminus. Attenuated but grossly patent flow within the left MCA distally. Left ACA is not visualized beyond the proximal left A2 segment, which could be related to edema and/or occlusion. 3. Nonvisualization of the left PCA beyond the proximal left P2 segment. Finding favored to be due to the left cerebral edema rather than occlusion given the lack of an associated sizable left PCA infarct. 4. 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 Per [facility], Dr. Cheral Marker of the neurology service is already aware of these findings at the time of this dictation. Electronically Signed   By: Jeannine Boga M.D.   On: 04/23/2022 04:45   ECHOCARDIOGRAM COMPLETE  Result Date: 04/22/2022    ECHOCARDIOGRAM REPORT   Patient Name:   Jermaine Gonzalez [facility] Date of Exam: 04/22/2022 Medical Rec #:  [id number]     Height:       69.0 in Accession #:    [id number]    Weight:       134.0 lb Date of Birth:  03/28/1951     BSA:          1.743 m Patient Age:    71 years      BP:           154/73 mmHg Patient Gender: M             HR:           75 bpm. Exam Location:  Inpatient Procedure: 2D Echo Indications:    Stroke  History:        Patient has no prior history of Echocardiogram examinations.                 Stroke; Risk Factors:Current Smoker.  Sonographer:    Harvie Junior Referring Phys: [phone] ERIC LINDZEN  Sonographer Comments: Technically difficult study due to poor echo windows. Image acquisition challenging due to patient behavioral factors. and supine. IMPRESSIONS  1. Left ventricular ejection fraction, by estimation, is 60 to 65%. Left ventricular ejection fraction by PLAX is 61 %. The left ventricle has normal function. The left ventricle has no regional wall motion abnormalities. Left ventricular diastolic parameters are consistent with Grade I diastolic dysfunction (impaired  relaxation).  2. Right ventricular systolic function is normal. The right ventricular size is normal. There is normal pulmonary artery systolic pressure. The estimated  right ventricular systolic pressure is 92.1 mmHg.  3. The mitral valve is abnormal. Trivial mitral valve regurgitation.  4. The aortic valve was not well visualized. Aortic valve regurgitation is trivial. Aortic valve sclerosis is present, with no evidence of aortic valve stenosis.  5. The inferior vena cava is normal in size with greater than 50% respiratory variability, suggesting right atrial pressure of 3 mmHg. Comparison(s): No prior Echocardiogram. FINDINGS  Left Ventricle: Left ventricular ejection fraction, by estimation, is 60 to 65%. Left ventricular ejection fraction by PLAX is 61 %. The left ventricle has normal function. The left ventricle has no regional wall motion abnormalities. The left ventricular internal cavity size was normal in size. There is no left ventricular hypertrophy. Left ventricular diastolic parameters are consistent with Grade I diastolic dysfunction (impaired relaxation). Indeterminate filling pressures. Right Ventricle: The right ventricular size is normal. No increase in right ventricular wall thickness. Right ventricular systolic function is normal. There is normal pulmonary artery systolic pressure. The tricuspid regurgitant velocity is 2.23 m/s, and  with an assumed right atrial pressure of 3 mmHg, the estimated right ventricular systolic pressure is 19.4 mmHg. Left Atrium: Left atrial size was normal in size. Right Atrium: Right atrial size was normal in size. Pericardium: There is no evidence of pericardial effusion. Mitral Valve: The mitral valve is abnormal. There is mild calcification of the mitral valve leaflet(s). Mild mitral annular calcification. Trivial mitral valve regurgitation. Tricuspid Valve: The tricuspid valve is grossly normal. Tricuspid valve regurgitation is trivial. Aortic Valve: The aortic  valve was not well visualized. Aortic valve regurgitation is trivial. Aortic regurgitation PHT measures 418 msec. Aortic valve sclerosis is present, with no evidence of aortic valve stenosis. Aortic valve mean gradient measures 3.0 mmHg. Aortic valve peak gradient measures 7.4 mmHg. Aortic valve area, by VTI measures 2.70 cm. Pulmonic Valve: The pulmonic valve was grossly normal. Pulmonic valve regurgitation is trivial. Aorta: The aortic root and ascending aorta are structurally normal, with no evidence of dilitation. Venous: The inferior vena cava is normal in size with greater than 50% respiratory variability, suggesting right atrial pressure of 3 mmHg. IAS/Shunts: No atrial level shunt detected by color flow Doppler.  LEFT VENTRICLE PLAX 2D LV EF:         Left            Diastology                ventricular     LV e' medial:    7.29 cm/s                ejection        LV E/e' medial:  11.8                fraction by     LV e' lateral:   10.00 cm/s                PLAX is 61      LV E/e' lateral: 8.6                %. LVIDd:         4.90 cm LVIDs:         3.30 cm LV PW:         0.90 cm LV IVS:        0.90 cm LVOT diam:     1.90 cm LV SV:         65 LV SV Index:   38 LVOT  Area:     2.84 cm  LV Volumes (MOD) LV vol d, MOD    51.8 ml A2C: LV vol d, MOD    86.5 ml A4C: LV vol s, MOD    22.8 ml A2C: LV vol s, MOD    35.1 ml A4C: LV SV MOD A2C:   29.0 ml LV SV MOD A4C:   86.5 ml LV SV MOD BP:    42.7 ml RIGHT VENTRICLE RV Basal diam:  3.30 cm RV Mid diam:    2.80 cm RV S prime:     12.80 cm/s TAPSE (M-mode): 1.6 cm LEFT ATRIUM             Index        RIGHT ATRIUM           Index LA diam:        3.90 cm 2.24 cm/m   RA Area:     14.80 cm LA Vol (A2C):   37.9 ml 21.75 ml/m  RA Volume:   37.60 ml  21.57 ml/m LA Vol (A4C):   40.7 ml 23.35 ml/m LA Biplane Vol: 40.9 ml 23.47 ml/m  AORTIC VALVE                    PULMONIC VALVE AV Area (Vmax):    2.44 cm     PV Vmax:       1.11 m/s AV Area (Vmean):   2.76 cm     PV  Peak grad:  4.9 mmHg AV Area (VTI):     2.70 cm AV Vmax:           136.00 cm/s AV Vmean:          79.800 cm/s AV VTI:            0.243 m AV Peak Grad:      7.4 mmHg AV Mean Grad:      3.0 mmHg LVOT Vmax:         117.00 cm/s LVOT Vmean:        77.600 cm/s LVOT VTI:          0.231 m LVOT/AV VTI ratio: 0.95 AI PHT:            418 msec  AORTA Ao Root diam: 3.20 cm Ao Asc diam:  2.80 cm MITRAL VALVE               TRICUSPID VALVE MV Area (PHT): 4.60 cm    TR Peak grad:   19.9 mmHg MV Decel Time: 165 msec    TR Vmax:        223.00 cm/s MR Peak grad: 50.3 mmHg MR Vmax:      354.50 cm/s  SHUNTS MV E velocity: 85.70 cm/s  Systemic VTI:  0.23 m MV A velocity: 88.80 cm/s  Systemic Diam: 1.90 cm MV E/A ratio:  0.97 Lyman Bishop MD Electronically signed by Lyman Bishop MD Signature Date/Time: 04/22/2022/3:52:23 PM    Final    VAS [place] LOWER EXTREMITY VENOUS (DVT)  Result Date: 04/22/2022  Lower Venous DVT Study Patient Name:  RAUNAK ANTUNA [facility]  Date of Exam:   04/22/2022 Medical Rec #: [id number]      Accession #:    [id number] Date of Birth: 1951/05/21      Patient Gender: M Patient Age:   68 years Exam Location:  [facility] Procedure:      VAS [place] LOWER EXTREMITY VENOUS (DVT) Referring Phys: Cornelius Moras XU --------------------------------------------------------------------------------  Indications: Stroke. Other Indications: Rt BKA. Comparison Study: no prior Performing Technologist: Archie Patten RVS  Examination Guidelines: A complete evaluation includes B-mode imaging, spectral Doppler, color Doppler, and power Doppler as needed of all accessible portions of each vessel. Bilateral testing is considered an integral part of a complete examination. Limited examinations for reoccurring indications may be performed as noted. The reflux portion of the exam is performed with the patient in reverse Trendelenburg.  +---------+---------------+---------+-----------+----------+--------------+ RIGHT     CompressibilityPhasicitySpontaneityPropertiesThrombus Aging +---------+---------------+---------+-----------+----------+--------------+ CFV      Full           Yes      Yes                                 +---------+---------------+---------+-----------+----------+--------------+ SFJ      Full                                                        +---------+---------------+---------+-----------+----------+--------------+ FV Prox  Full                                                        +---------+---------------+---------+-----------+----------+--------------+ FV Mid   Full                                                        +---------+---------------+---------+-----------+----------+--------------+ FV DistalFull                                                        +---------+---------------+---------+-----------+----------+--------------+ PFV      Full                                                        +---------+---------------+---------+-----------+----------+--------------+ POP      Full           Yes      Yes                                 +---------+---------------+---------+-----------+----------+--------------+ PTV                                                   BKA            +---------+---------------+---------+-----------+----------+--------------+ PERO  BKA            +---------+---------------+---------+-----------+----------+--------------+   +---------+---------------+---------+-----------+----------+--------------+ LEFT     CompressibilityPhasicitySpontaneityPropertiesThrombus Aging +---------+---------------+---------+-----------+----------+--------------+ CFV      Full           Yes      Yes                                 +---------+---------------+---------+-----------+----------+--------------+ SFJ      Full                                                         +---------+---------------+---------+-----------+----------+--------------+ FV Prox  Full                                                        +---------+---------------+---------+-----------+----------+--------------+ FV Mid   Full                                                        +---------+---------------+---------+-----------+----------+--------------+ FV DistalFull                                                        +---------+---------------+---------+-----------+----------+--------------+ PFV      Full                                                        +---------+---------------+---------+-----------+----------+--------------+ POP      Full           Yes      Yes                                 +---------+---------------+---------+-----------+----------+--------------+ PTV      Full                                                        +---------+---------------+---------+-----------+----------+--------------+ PERO     Full                                                        +---------+---------------+---------+-----------+----------+--------------+     Summary: BILATERAL: - No evidence of deep vein thrombosis seen in the lower extremities, bilaterally. -No evidence of popliteal cyst, bilaterally.   *See table(s) above for  measurements and observations. Electronically signed by Servando Snare MD on 04/22/2022 at 3:26:32 PM.    Final    US RENAL  Result Date: 04/22/2022 CLINICAL DATA:  Acute kidney injury EXAM: RENAL / URINARY TRACT ULTRASOUND COMPLETE COMPARISON:  Ultrasound abdomen 04/06/2014 FINDINGS: Right Kidney: Renal measurements: 10.0 x 4.3 x 4.2 cm = volume: 94 mL. Echogenicity within normal limits. No mass or hydronephrosis visualized. Left Kidney: Renal measurements: 11.2 x 5.5 x 5.2 cm = volume: 170 mL. Severe left hydronephrosis. No renal mass or stone. Bladder: Empty Other: None. IMPRESSION: No acute  abnormality right kidney.  Decreased right renal volume. Severe left hydronephrosis, not present on the prior study 2015 Electronically Signed   By: Franchot Gallo M.D.   On: 04/22/2022 13:52   DG CHEST PORT 1 VIEW  Result Date: 04/22/2022 CLINICAL DATA:  OG tube placement EXAM: PORTABLE CHEST 1 VIEW COMPARISON:  None available FINDINGS: Endotracheal tube is 5.5 cm above the carina. OG tube is in the fundus of the stomach. Heart is normal size. Right lung clear. Left lower lobe atelectasis or infiltrate. No acute bony abnormality. IMPRESSION: Left lower lobe atelectasis or infiltrate. Electronically Signed   By: Rolm Baptise M.D.   On: 04/22/2022 01:26   CT HEAD CODE STROKE WO CONTRAST  Result Date: 04/17/2022 CLINICAL DATA:  Code stroke. EXAM: CT HEAD WITHOUT CONTRAST TECHNIQUE: Contiguous axial images were obtained from the base of the skull through the vertex without intravenous contrast. RADIATION DOSE REDUCTION: This exam was performed according to the departmental dose-optimization program which includes automated exposure control, adjustment of the mA and/or kV according to patient size and/or use of iterative reconstruction technique. COMPARISON:  Prior MRI from 02/26/2021. FINDINGS: Brain: Age-related cerebral atrophy with chronic small vessel ischemic disease. Remote lacunar infarcts present at the left basal ganglia. No acute intracranial hemorrhage. Subtle loss of gray-white matter differentiation seen involving the left insular cortex as well as the overlying supra ganglionic left cerebral hemisphere, concerning for evolving acute early left MCA distribution infarct. No mass lesion, mass effect, or midline shift. No hydrocephalus or extra-axial fluid collection. Vascular: Asymmetric hyperdensity seen involving the left ICA terminus/left M1 segment, concerning for large vessel occlusion. Skull: Scalp soft tissues and calvarium within normal limits. Sinuses/Orbits: Globes and orbital soft  tissues demonstrate no acute finding. Paranasal sinuses and mastoid air cells are clear. Other: None. ASPECTS [facility] Stroke Program Early CT Score) - Ganglionic level infarction (caudate, lentiform nuclei, internal capsule, insula, M1-M3 cortex): 6 - Supraganglionic infarction (M4-M6 cortex): 1 Total score (0-10 with 10 being normal): 7 IMPRESSION: 1. Hyperdense left ICA terminus/left M1 segment, concerning for thrombus/LVO. Probable subtle early evolving left MCA distribution infarct involving the left insula and overlying supra ganglionic left cerebral hemisphere. No intracranial hemorrhage. 2. ASPECTS is 7. These results were communicated to Dr. Cheral Marker at 9:39 pm on 04/29/2022 by text page via the [facility] messaging system. Electronically Signed   By: Jeannine Boga M.D.   On: 05/06/2022 21:43    Labs: BMET Recent Labs  Lab 05/01/2022 2120 04/11/2022 2121 04/22/2022 2321 04/22/22 0248 04/22/22 0540 04/22/22 1730 04/23/22 0111 04/23/22 0430 04/23/22 0733  NA 138 137 136 136 134*  --  135 140 142  K 4.1 4.1 3.6 3.5 3.9  --   --  3.9  --   CL 104 105  --   --  105  --   --  108  --   CO2  --  20*  --   --  19*  --   --  19*  --   GLUCOSE 98 104*  --   --  126*  --   --  161*  --   BUN 33* 30*  --   --  34*  --   --  42*  --   CREATININE 2.60* 2.47*  --   --  2.55*  --   --  3.23*  --   CALCIUM  --  9.0  --   --  7.8*  --   --  8.1*  --   PHOS  --   --   --   --   --  2.6  --  4.1  --    CBC Recent Labs  Lab 04/22/2022 2121 04/27/2022 2321 04/22/22 0248 04/22/22 0540 04/23/22 0430  WBC 10.1  --   --  10.8* 9.6  NEUTROABS 5.9  --   --  8.9*  --   HGB 13.4 9.5* 10.5* 10.6* 8.5*  HCT 40.2 28.0* 31.0* 31.2* 25.8*  MCV 88.2  --   --  87.9 88.4  PLT 299  --   --  235 181    Medications:     amLODipine  5 mg Per Tube Daily   atenolol  50 mg Per Tube Daily   Chlorhexidine Gluconate Cloth  6 each Topical Q0600   feeding supplement (PROSource TF20)  60 mL Per Tube Daily   insulin  aspart  0-6 Units Subcutaneous Q4H   ipratropium-albuterol  3 mL Nebulization Q6H   multivitamin with minerals  1 tablet Per Tube Daily   mouth rinse  15 mL Mouth Rinse Q2H   pantoprazole  40 mg Per Tube Daily   polyethylene glycol  17 g Per Tube Daily   sodium bicarbonate  650 mg Per Tube TID      Gean Quint, MD [facility] 04/23/2022, 12:25 PM

## 2022-04-23 NOTE — Progress Notes (Signed)
This RN paged neuro MD Lindzen about BP parameters given results of MRI. Per MD Lindzen new BP orders are SBP 130-150.  Care ongoing, RN

## 2022-04-23 NOTE — Progress Notes (Addendum)
MRI completed. Official Radiology report pending. Preliminary review of images reveals massive left hemispheric stroke involving the ACA and MCA territories with significant mass effect and left to right midline shift. Patient continues to be intubated. Attempted to contact wife by telephone with no answer.   A/R: 71 year old male s/p TNK and thrombectomy for distal ICA T-occlusion. Now with malignant edema in left cerebral hemisphere secondary to massive completed stroke.  - Starting hypertonic saline at 50 cc/hr.  - Calling Neurosurgery for consideration of possible hemicraniectomy  Addendum: - Discussed case with Neurosurgery on call. They have agreed to assess imaging and his history to determine if he is a surgical candidate. Overall initial impression is that he may not be a good surgical candidate given age and frailty.  - Again attempted to contact wife by telephone with no answer.  - Continuing hypertonic saline.   Electronically signed: Dr. Kerney Elbe

## 2022-04-23 NOTE — Progress Notes (Signed)
PT Cancellation Note  Patient Details Name: Jermaine Gonzalez MRN: [id number] DOB: Mar 28, 1951   Cancelled Treatment:    Reason Eval/Treat Not Completed: Patient not medically ready. Pt with neurologic decline with MD confirming therapy can sign off at this time. Please re-consult PT if plans and pt's status change.   Moishe Spice, PT, DPT Acute Rehabilitation Services  Office: [place] 04/23/2022, 8:55 AM

## 2022-04-23 NOTE — Progress Notes (Signed)
An USGPIV (ultrasound guided PIV) has been placed for short-term vasopressor infusion. A correctly placed ivWatch must be used when administering Vasopressors. Should this treatment be needed beyond 72 hours, central line access should be obtained.  It will be the responsibility of the bedside nurse to follow best practice to prevent extravasations.   ?

## 2022-04-23 NOTE — Progress Notes (Signed)
Patient hypothermic Temp 96. Bair hugger applied. Esophageal probe placed for close monitoring.

## 2022-04-23 NOTE — Progress Notes (Signed)
This RN paged Neuro MD on call Lindzen as well as E-link that 24 hour post TNK MRI results had pended.   Patient's neuro exam has remained the same throughout entirety of shift. Patient withdraws to pain and has intact reflexes.  Orders placed by MD Lindzen to initiate hypertonic saline at 50cc/hr.   Care ongoing.   Wyn Quaker, RN

## 2022-04-23 NOTE — Progress Notes (Signed)
Patient hypoglycemic 53. Rechecked and glucose 56. Standing hypoglycemia orders placed. Dextrose amp given. Dr. Erlinda Hong made aware on rounds. Repeat glucose 174.

## 2022-04-23 NOTE — Progress Notes (Signed)
eLink Physician-Brief Progress Note Patient Name: Jermaine Gonzalez DOB: Feb 25, 1951 MRN: [id number]   Date of Service  04/23/2022  HPI/Events of Note  Hypotension - BP = 92/48. Goal SBP = 130-150. Cleviprex has been off for 60 minutes.   eICU Interventions  Plan: 1. Phenylephrine IV infusion via PIV. Titrate to SBP - 130-150.  2. Bolus with 0.9 NaCl 1 liter IV over 30 minutes now.      Intervention Category Major Interventions: Hypotension - evaluation and management  Pluma Diniz [place] 04/23/2022, 3:53 AM

## 2022-04-23 NOTE — Progress Notes (Addendum)
Upon 0300 assessment, this RN noted patient to no longer have a response in any extremity. Pupils now 4 round and sluggish. Cough and gag reflexes intact. Left babinski reflex intact. E-link Dr. Oletta Darter paged about labile blood pressures. 500 NS bolus and phenylephrine ordered.   Upon 0400 assessment, this RN noted pupils to be 4 round and non-reactive with cough and gag reflex now absent. Left babinski reflex still intact.   [place] notified. Referral number - [id number]  MD Cheral Marker paged. Return page received with no new orders.   MD Cheral Marker and this RN have attempted to call wife and sister multiple times, but have been unable to reach them. Voicemail messages left.   Care ongoing.   Wyn Quaker, RN

## 2022-04-23 NOTE — Inpatient Diabetes Management (Signed)
Inpatient Diabetes Program Recommendations  AACE/ADA: New Consensus Statement on Inpatient Glycemic Control (2015)  Target Ranges:  Prepandial:   less than 140 mg/dL      Peak postprandial:   less than 180 mg/dL (1-2 hours)      Critically ill patients:  140 - 180 mg/dL   Lab Results  Component Value Date   GLUCAP 174 (H) 04/23/2022   HGBA1C 5.2 04/22/2022    Review of Glycemic Control  Latest Reference Range & Units 04/23/22 00:07 04/23/22 03:47 04/23/22 08:38 04/23/22 08:52 04/23/22 09:24  Glucose-Capillary 70 - 99 mg/dL 145 (H) 170 (H) 53 (L) 56 (L) 174 (H)   Current orders for Inpatient glycemic control:  Novolog 0-9 units Q4 hours Osmolite 60 ml/hour A1c 5.2% on 10/17  Inpatient Diabetes Program Recommendations:    Hypoglycemia on sensitive SSI on tube feeds Osmolite 60 ml/hour  -  if in the plan of care consider: reducing Novolog Correction to 0-6 units "very sensitive" Q4 hours starting at 151 mg/dl.  Thanks,  Tama Headings RN, MSN, BC-ADM Inpatient Diabetes Coordinator Team Pager [phone] (8a-5p)

## 2022-04-23 NOTE — Progress Notes (Signed)
[place] notified of patient's change in code status.

## 2022-04-23 NOTE — Progress Notes (Signed)
OT Cancellation Note and Sign off  Patient Details Name: CADEN FUKUSHIMA MRN: [id number] DOB: 08/25/1950   Cancelled Treatment:    Reason Eval/Treat Not Completed: Medical issues which prohibited therapy. Pt with severe decline overnight. OT will sign off.  [place] 04/23/2022, 8:44 AM  Jesse Sans OTR/L Acute Rehabilitation Services Office: [phone]

## 2022-04-23 NOTE — Progress Notes (Signed)
SLP Cancellation Note  Patient Details Name: Jermaine Gonzalez MRN: [id number] DOB: 1951-06-25   Cancelled treatment:       Reason Eval/Treat Not Completed: Other (comment) (Patient unfortunately became unresponsive and lost all reflexes previous night. SLP to s/o.)   Sonia Baller, MA, CCC-SLP Speech Therapy

## 2022-04-24 DIAGNOSIS — G935 Compression of brain: Secondary | ICD-10-CM | POA: Diagnosis not present

## 2022-04-24 DIAGNOSIS — Z9911 Dependence on respirator [ventilator] status: Secondary | ICD-10-CM | POA: Diagnosis not present

## 2022-04-24 DIAGNOSIS — J9601 Acute respiratory failure with hypoxia: Secondary | ICD-10-CM | POA: Diagnosis not present

## 2022-04-24 DIAGNOSIS — I63512 Cerebral infarction due to unspecified occlusion or stenosis of left middle cerebral artery: Secondary | ICD-10-CM

## 2022-04-24 DIAGNOSIS — I6602 Occlusion and stenosis of left middle cerebral artery: Secondary | ICD-10-CM | POA: Diagnosis not present

## 2022-04-24 DIAGNOSIS — G936 Cerebral edema: Secondary | ICD-10-CM

## 2022-04-24 DIAGNOSIS — I63312 Cerebral infarction due to thrombosis of left middle cerebral artery: Secondary | ICD-10-CM | POA: Diagnosis not present

## 2022-04-24 DIAGNOSIS — N179 Acute kidney failure, unspecified: Secondary | ICD-10-CM | POA: Diagnosis not present

## 2022-04-24 LAB — GLUCOSE, CAPILLARY
Glucose-Capillary: 104 mg/dL — ABNORMAL HIGH (ref 70–99)
Glucose-Capillary: 119 mg/dL — ABNORMAL HIGH (ref 70–99)

## 2022-04-24 MED ORDER — LORAZEPAM 1 MG PO TABS: 1.0000 mg | ORAL_TABLET | ORAL | Status: DC | PRN

## 2022-04-24 MED ORDER — LORAZEPAM 2 MG/ML PO CONC: 1.0000 mg | ORAL | Status: DC | PRN

## 2022-04-24 MED ORDER — HYDRALAZINE HCL 20 MG/ML IJ SOLN
20.0000 mg | INTRAMUSCULAR | Status: DC | PRN
  Administered 2022-04-24 (×2): 20 mg via INTRAVENOUS
  Filled 2022-04-24 (×2): qty 1

## 2022-04-24 MED ORDER — MORPHINE BOLUS VIA INFUSION: 2.0000 mg | INTRAVENOUS | Status: DC | PRN

## 2022-04-24 MED ORDER — POLYVINYL ALCOHOL 1.4 % OP SOLN: 1.0000 [drp] | Freq: Four times a day (QID) | OPHTHALMIC | Status: DC | PRN

## 2022-04-24 MED ORDER — LORAZEPAM 2 MG/ML IJ SOLN: 1.0000 mg | INTRAMUSCULAR | Status: DC | PRN

## 2022-04-24 MED ORDER — HALOPERIDOL LACTATE 5 MG/ML IJ SOLN: 0.5000 mg | INTRAMUSCULAR | Status: DC | PRN

## 2022-04-24 MED ORDER — ONDANSETRON 4 MG PO TBDP: 4.0000 mg | ORAL_TABLET | Freq: Four times a day (QID) | ORAL | Status: DC | PRN

## 2022-04-24 MED ORDER — MORPHINE 100MG IN NS 100ML (1MG/ML) PREMIX INFUSION
5.0000 mg/h | INTRAVENOUS | Status: DC
  Administered 2022-04-24: 5 mg/h via INTRAVENOUS
  Filled 2022-04-24: qty 100

## 2022-04-24 MED ORDER — BIOTENE DRY MOUTH MT LIQD: 15.0000 mL | OROMUCOSAL | Status: DC | PRN

## 2022-04-24 MED ORDER — ONDANSETRON HCL 4 MG/2ML IJ SOLN: 4.0000 mg | Freq: Four times a day (QID) | INTRAMUSCULAR | Status: DC | PRN

## 2022-04-24 MED ORDER — HALOPERIDOL LACTATE 2 MG/ML PO CONC: 0.5000 mg | ORAL | Status: DC | PRN

## 2022-04-24 MED ORDER — HALOPERIDOL 0.5 MG PO TABS: 0.5000 mg | ORAL_TABLET | ORAL | Status: DC | PRN

## 2022-05-07 NOTE — Death Summary Note (Signed)
Patient ID: AJAI HARVILLE MRN: [id number] DOB/AGE: 08-02-1950 71 y.o.  Admit date: 2022/05/14 Death date: 05-17-2022  Admission Diagnoses: left ACA and MCA infarct, likely embolic due to atrial fibrillation  Cause of Death: cerebral edema and brain herniation caused by massive left ACA and MCA stroke  Pertinent Medical Diagnosis: Principal Problem:   Stroke (cerebrum) ([place]) Active Problems:   Middle cerebral artery embolism, left   Ventilator dependence (HCC)   Malnutrition of moderate degree   Hospital Course: Patient with history of HTN, COPD, CAD, MI, skin cancer, hepatitis C and remote ETOH use was admitted with right sided weakness and aphasia.  He was found to have a left ACA and MCA stroke.  He was given TNK and taken to IR for thrombectomy on 05/15/23, which was successful.  He was left intubated after the procedure due to agitation.  Overnight on 10/17, patient's neurological exam deteriorated with decreased responsiveness to noxious stimuli and loss of some brainstem reflexes.  MRI demonstrated massive left ACA and MCA infarcts with extensive edema, 1.6 cm midline shift and developing hydrocephalus.  Due to poor prognosis, he was not a candidate for surgery.  Patient was made comfort care on 05-18-2023 when his family arrived at the bedside, was extubated and died at 78.  Signed: Katy Apo May 17, 2022, 12:29 PM
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
## 2022-05-07 NOTE — Procedures (Signed)
Extubation Procedure Note  Patient Details:   Name: Jermaine Gonzalez DOB: January 03, 1951 MRN: [id number]   Airway Documentation:    Vent end date: May 21, 2022 Vent end time: 1127   Evaluation  O2 sats: currently acceptable Complications: No apparent complications Patient did tolerate procedure well. Bilateral Breath Sounds: Clear, Diminished   No  Pt extubated to comfort care.  Margarita Croke 21-May-2022, 11:29 AM

## 2022-05-07 NOTE — IPAL (Signed)
  Interdisciplinary Goals of Care Family Meeting   Date carried out: 04/23/2022  Location of the meeting: Bedside  Member's involved: PCCM Physician Assistant, Bedside Registered Nurse and Family Member(s) or next of kin (patient's wife Jermaine Gonzalez, daughter).  Durable Power of Tour manager: Jermaine Gonzalez (wife)    Discussion: We discussed goals of care for Jermaine Gonzalez. After patient's significant clinical decline from 10/17PM into 10/18AM, family meeting was called in order to update patient's wife and discuss GOC/code status. I explained in detail patient's initial stroke and progression of neurologic concerns since initial intervention. We reviewed patient's CT Head on admission and patient's subsequent MRI/MRA post-NIR intervention. Ultimately, after seeing imaging patient's wife recognized the significant neurologic devastation that Jermaine Gonzalez has suffered and was appropriately tearful but understanding of patient's poor prognosis and requested time to process the shock of this information. She did note that she would like to transition to DNR code status and allow additional family to visit prior to any other withdrawal of interventions.  Code status: Full DNR with plan for transition to comfort care once family has had an opportunity to visit.  Disposition: In-patient comfort care once family has been able to visit.  Time spent for the meeting: 25 minutes  Jermaine Gonzalez, Jermaine Gonzalez Pulmonary & Critical Care 04/23/2022  Please see [email or web address] for pager details.  From 7A-7P if no response, please call [phone] After hours, please call ELink [phone]

## 2022-05-07 NOTE — Progress Notes (Signed)
Chart reviewed, discussed with primary service. Now DNR, no labs. Likely transitioning to comfort care once family has had an opportunity to visit. Will sign off from a nephrology perspective. Please call with any questions/concerns.  Gean Quint, MD [facility]

## 2022-05-07 NOTE — Progress Notes (Signed)
NAME:  ALISTAIR SENFT, MRN:  [id number], DOB:  1950/07/13, LOS: 3 ADMISSION DATE:  05/02/2022, CONSULTATION DATE:  04/22/2022 REFERRING MD:  Genevie Cheshire, CHIEF COMPLAINT:  CVA   History of Present Illness:  71 year old man who presented to [facility] ED 10/16 as a Code Stroke after a fall at home with R-sided weakness and aphasia.  EMS noted R facial droop, left gaze deviation, R hemiplegia and global aphasia with incontinence. LKN 2000 10/16. Patient's wife noted multiple falls over the past 5 weeks. PMHx significant for HTN, COPD, CAD and MI, skin Ca, HEP C and remote EtOH use.  CT Head showed large L cerebral lesion with hyperdense left ICA terminus/left M1 segment, concerning for thrombus/LVO. Patient was given TNK and taken to [facility] for thrombectomy.   Arteriogram showed occluded left internal carotid artery terminus, left ACA A1 segment, and left MCA M1 segment. Revascularized with 1 pass, TICI 3.  Due to patient's agitation, he was left intubated after procedure.  Labs were significant for Cr 2.4, wife noted recent dark urine.  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 Made DNR. Awaiting family arrival then transition to comfort care. 10/19 No change in neurologic examination. Remains DNR. Transition to comfort care when family is ready.  Interim History /  Subjective:  No significant events overnight Slightly more hypertensive today, off of Neo BP labile Remains DNR as of 10/18 Wife/daughter awaiting additional family member visitation Transition to [place] once family is ready  Objective:  Blood pressure (!) 144/77, pulse 90, temperature (!) 96.6 F (35.9 C), resp. rate 14, height '5\' 9"'$  (1.753 m), weight 60.8 kg, SpO2 97 %.    Vent Mode: PRVC FiO2 (%):  [40 %] 40 % Set Rate:  [14 bmp] 14 bmp Vt Set:  [560 mL] 560 mL PEEP:  [5 cmH20] 5 cmH20 Plateau Pressure:  [13 cmH20-17 cmH20] 16 cmH20   Intake/Output Summary (Last 24 hours) at 05-11-22 0737 Last data filed at May 11, 2022 0600 Gross per 24 hour  Intake 1019.97 ml  Output 1950 ml  Net -930.03 ml    Filed Weights   04/11/2022 2100  Weight: 60.8 kg   Physical Examination: General: Acute-on-chronically ill-appearing elderly man in NAD. Unresponsive. HEENT: [place]/AT, anicteric sclera,  pupils fixed and dilated, 10m bilaterally/nonreactive, moist mucous membranes. ETT/OGT in place. Neuro: Comatose. Does not respond to verbal, tactile or noxious stimuli. Weak triple flexion response in LLE; some nonpurposeful spontaneous leg movements with stimulation. Not following commands. No cough/gag/corneals. CV: Irregularly irregular rhythm, rate 90s, no m/g/r. PULM: Breathing even and unlabored on vent (PEEP 5, FiO2 40%). Not breathing over the vent. Lung fields CTAB. GI: Soft, nontender, nondistended. Normoactive bowel sounds. Extremities: No LE edema noted, R BKA. Skin: Warm/dry, no rashes.  Resolved Hospital Problem List:     Assessment & Plan:  Acute L MCA CVA Left anterior cerebral artery A1 segment, and left middle cerebral artery M1 segment LVO Brain compression secondary to cerebral edema s/p ischemic stroke CT Head 10/16 with hyperdense left ICA terminus/left M1 segment, concerning for thrombus/LVO; no ICH. TNK given. Proceeded to [facility] for thrombectomy; TICI 3  recanalization achieved. MRI/MRA 10/18 AM with massive acute L cerebral infarction involving essentially the entirety of the left MCA and ACA distributions; associated petechial blood products without frank HT; associated extensive edema and regional mass effect with interval development of 1.6 cm of left-to-right shift. - Stroke primary, appreciate assistance with management - MRI/MRA 10/18 with devastating neurologic injury of L ACA/L MCA territories - No significant change in serial neurologic exams - HTS 3% per Stroke - Goal SBP < 180; BP labile in the setting of brain compression; intermittently hypotensive requiring Neosynephrine - Per Stroke team, no indication for repeat brain imaging as MRI/MRA 10/18 already with catastrophic infarcts despite NIR/TNK - Patient made DNR code status 10/18 - Plan for transition to comfort care once family has had desired visitation  Renal insufficiency, presumed acute on chronic NAMGA Baseline Cr likely ~2.0 (last Cr 2.0 at [facility] 2021). - Trend BMP - Replete electrolytes as indicated - Monitor I&Os - Avoid nephrotoxic agents as able - Ensure adequate renal perfusion  Acute hypoxemic respiratory failure in the setting of ischemic stroke History of COPD - Continue full vent support (4-8cc/kg IBW) - Wean FiO2 for O2 sat > 90% - Unable to complete SAT/SBT given worsening neurologic exam; not currently breathing over the vent 10/19AM - VAP bundle - Bronchodilators PRN for wheezing - Pulmonary hygiene - PAD protocol for sedation: Off sedation at present, remains comatose  HTN - BP labile in the setting of worsening cerebral edema/brain compression - Intermittently requiring Neosynephrine versus PRNs for HTN - Goal SBP liberalized to < 180  Hypertriglyceridemia, iatrogenic - Off of propofol/cleviprex  GOC MRI/MRA 10/18AM with devastating neurologic injury and large area of ischemia affecting L ACA/L MCA territories. Severe cerebral edema/brain  compression with 1.6cm midline shift. Further worsening neurologic status with concern for impending herniation. - DNR code status established by family 10/18 - Plan for transition to comfort care once family has had an opportunity to visit, likely today 10/19 - Anticipate in-hospital death  Best Practice: (right click and "Reselect all SmartList Selections" daily)   Diet/type: NPO DVT prophylaxis: SCD GI prophylaxis: PPI Lines: N/A Foley:  N/A Code Status:  DNR with transition to comfort care 10/19, pending family readiness Last date of multidisciplinary goals of care discussion [Per Primary Team]  Critical care time:   The patient is critically ill with multiple organ system failure and requires high complexity decision making for assessment and support, frequent evaluation and titration of therapies, advanced monitoring, review of radiographic studies and interpretation of complex data.   Critical Care Time devoted to patient care services, exclusive of separately billable procedures, described in this note is 34 minutes.  Lestine Mount, PA-C  Pulmonary & Critical Care 05-04-2022 7:37 AM  Please see [email or web address] for pager details.  From 7A-7P if no response, please call [phone] After hours, please call ELink [phone]

## 2022-05-07 NOTE — Progress Notes (Signed)
   05/10/2022 1200  Attending [place]  Attending Physician Notified Y  Attending Physician (First and Last Name) Cinda Quest NP  Post Mortem Checklist  Date of Death 05/10/2022  Time of Death 10/04/31  Pronounced By Anselm Pancoast, RN/Marcus [place], RN  Next of kin notified Yes  Name of next of kin notified of death Dorriann Twichell  Contact Person's Relationship to Patient Spouse  Contact Person's Phone Number [phone]  Contact Person's address [place], [place]  Was the patient a No Code Blue or a Limited Code Blue? Yes  Did the patient die unattended? No  Patient restrained? Not applicable  Autopsy  Autopsy requested by MD or Family ( Non ME Case) N/A  Patient and [place] Returned  Patient is satisfied that all belongings have been returned? Yes  Name of person receiving valuables? Dorriann Shibata  Specify valuables returned Two rings from patient hand  Dead on [place] (Emergency Department)  Patient dead on arrival? No  Medical Examiner  Is this a medical examiner's case? N

## 2022-05-07 DEATH — deceased
